# Patient Record
Sex: Male | Born: 1945 | Race: White | Hispanic: No | Marital: Married | Smoking: Never smoker
Health system: Southern US, Community
[De-identification: ages and names within clinical notes are randomized; demographics above are authoritative.]

## PROBLEM LIST (undated history)

## (undated) DIAGNOSIS — E7800 Pure hypercholesterolemia, unspecified: Secondary | ICD-10-CM

## (undated) DIAGNOSIS — I10 Essential (primary) hypertension: Secondary | ICD-10-CM

## (undated) DIAGNOSIS — I359 Nonrheumatic aortic valve disorder, unspecified: Secondary | ICD-10-CM

## (undated) DIAGNOSIS — K3189 Other diseases of stomach and duodenum: Secondary | ICD-10-CM

## (undated) DIAGNOSIS — M109 Gout, unspecified: Secondary | ICD-10-CM

## (undated) DIAGNOSIS — I2510 Atherosclerotic heart disease of native coronary artery without angina pectoris: Secondary | ICD-10-CM

## (undated) HISTORY — PX: TONSILLECTOMY: SUR1361

## (undated) HISTORY — PX: OTHER SURGICAL HISTORY: SHX169

## (undated) HISTORY — DX: Atherosclerotic heart disease of native coronary artery without angina pectoris: I25.10

## (undated) HISTORY — PX: PROSTATECTOMY: SHX69

## (undated) HISTORY — DX: Nonrheumatic aortic valve disorder, unspecified: I35.9

---

## 2015-07-03 DIAGNOSIS — I2510 Atherosclerotic heart disease of native coronary artery without angina pectoris: Secondary | ICD-10-CM

## 2015-07-03 HISTORY — DX: Atherosclerotic heart disease of native coronary artery without angina pectoris: I25.10

## 2015-07-12 ENCOUNTER — Inpatient Hospital Stay (HOSPITAL_COMMUNITY): Payer: Medicare Other

## 2015-07-12 ENCOUNTER — Ambulatory Visit
Admission: EM | Admit: 2015-07-12 | Discharge: 2015-07-12 | Disposition: A | Discharge status: Other Acute Inpatient Hospital | Payer: Medicare Other | Attending: Family Medicine | Admitting: Family Medicine

## 2015-07-12 ENCOUNTER — Emergency Department: Payer: Medicare Other

## 2015-07-12 ENCOUNTER — Encounter
Admission: EM | Disposition: A | Discharge status: Other Acute Inpatient Hospital | Payer: Self-pay | Source: Home / Self Care | Attending: Emergency Medicine

## 2015-07-12 ENCOUNTER — Encounter (HOSPITAL_COMMUNITY): Payer: Self-pay

## 2015-07-12 ENCOUNTER — Encounter: Payer: Self-pay | Admitting: Emergency Medicine

## 2015-07-12 ENCOUNTER — Emergency Department
Admission: EM | Admit: 2015-07-12 | Discharge: 2015-07-12 | Disposition: A | Discharge status: Other Acute Inpatient Hospital | Payer: Medicare Other | Attending: Emergency Medicine | Admitting: Emergency Medicine

## 2015-07-12 ENCOUNTER — Inpatient Hospital Stay (HOSPITAL_COMMUNITY)
Admission: AD | Admit: 2015-07-12 | Discharge: 2015-07-21 | DRG: 220 | Disposition: A | Discharge status: Home / Self Care | Payer: Medicare Other | Source: Other Acute Inpatient Hospital | Attending: Cardiothoracic Surgery | Admitting: Cardiothoracic Surgery

## 2015-07-12 DIAGNOSIS — I255 Ischemic cardiomyopathy: Secondary | ICD-10-CM | POA: Diagnosis present

## 2015-07-12 DIAGNOSIS — I2119 ST elevation (STEMI) myocardial infarction involving other coronary artery of inferior wall: Secondary | ICD-10-CM | POA: Diagnosis present

## 2015-07-12 DIAGNOSIS — M109 Gout, unspecified: Secondary | ICD-10-CM | POA: Diagnosis not present

## 2015-07-12 DIAGNOSIS — R0602 Shortness of breath: Secondary | ICD-10-CM | POA: Diagnosis present

## 2015-07-12 DIAGNOSIS — I2109 ST elevation (STEMI) myocardial infarction involving other coronary artery of anterior wall: Secondary | ICD-10-CM | POA: Diagnosis not present

## 2015-07-12 DIAGNOSIS — I213 ST elevation (STEMI) myocardial infarction of unspecified site: Secondary | ICD-10-CM | POA: Diagnosis not present

## 2015-07-12 DIAGNOSIS — Z8546 Personal history of malignant neoplasm of prostate: Secondary | ICD-10-CM | POA: Diagnosis not present

## 2015-07-12 DIAGNOSIS — Z8249 Family history of ischemic heart disease and other diseases of the circulatory system: Secondary | ICD-10-CM | POA: Diagnosis not present

## 2015-07-12 DIAGNOSIS — Z09 Encounter for follow-up examination after completed treatment for conditions other than malignant neoplasm: Secondary | ICD-10-CM

## 2015-07-12 DIAGNOSIS — I25110 Atherosclerotic heart disease of native coronary artery with unstable angina pectoris: Secondary | ICD-10-CM

## 2015-07-12 DIAGNOSIS — D62 Acute posthemorrhagic anemia: Secondary | ICD-10-CM | POA: Diagnosis not present

## 2015-07-12 DIAGNOSIS — Z79899 Other long term (current) drug therapy: Secondary | ICD-10-CM | POA: Insufficient documentation

## 2015-07-12 DIAGNOSIS — I359 Nonrheumatic aortic valve disorder, unspecified: Secondary | ICD-10-CM | POA: Insufficient documentation

## 2015-07-12 DIAGNOSIS — I2111 ST elevation (STEMI) myocardial infarction involving right coronary artery: Secondary | ICD-10-CM | POA: Diagnosis not present

## 2015-07-12 DIAGNOSIS — Z951 Presence of aortocoronary bypass graft: Secondary | ICD-10-CM | POA: Diagnosis not present

## 2015-07-12 DIAGNOSIS — I10 Essential (primary) hypertension: Secondary | ICD-10-CM | POA: Insufficient documentation

## 2015-07-12 DIAGNOSIS — I2510 Atherosclerotic heart disease of native coronary artery without angina pectoris: Secondary | ICD-10-CM | POA: Diagnosis present

## 2015-07-12 DIAGNOSIS — E7800 Pure hypercholesterolemia, unspecified: Secondary | ICD-10-CM | POA: Insufficient documentation

## 2015-07-12 DIAGNOSIS — I209 Angina pectoris, unspecified: Secondary | ICD-10-CM | POA: Insufficient documentation

## 2015-07-12 DIAGNOSIS — E785 Hyperlipidemia, unspecified: Secondary | ICD-10-CM | POA: Diagnosis present

## 2015-07-12 DIAGNOSIS — I480 Paroxysmal atrial fibrillation: Secondary | ICD-10-CM | POA: Diagnosis present

## 2015-07-12 DIAGNOSIS — R079 Chest pain, unspecified: Secondary | ICD-10-CM | POA: Diagnosis present

## 2015-07-12 DIAGNOSIS — E876 Hypokalemia: Secondary | ICD-10-CM | POA: Diagnosis not present

## 2015-07-12 DIAGNOSIS — I350 Nonrheumatic aortic (valve) stenosis: Secondary | ICD-10-CM | POA: Diagnosis present

## 2015-07-12 DIAGNOSIS — Z954 Presence of other heart-valve replacement: Secondary | ICD-10-CM | POA: Diagnosis not present

## 2015-07-12 DIAGNOSIS — Z01810 Encounter for preprocedural cardiovascular examination: Secondary | ICD-10-CM

## 2015-07-12 DIAGNOSIS — I219 Acute myocardial infarction, unspecified: Secondary | ICD-10-CM

## 2015-07-12 DIAGNOSIS — Z7982 Long term (current) use of aspirin: Secondary | ICD-10-CM | POA: Diagnosis not present

## 2015-07-12 DIAGNOSIS — R112 Nausea with vomiting, unspecified: Secondary | ICD-10-CM | POA: Diagnosis present

## 2015-07-12 DIAGNOSIS — Z9689 Presence of other specified functional implants: Secondary | ICD-10-CM

## 2015-07-12 DIAGNOSIS — Z9079 Acquired absence of other genital organ(s): Secondary | ICD-10-CM | POA: Diagnosis not present

## 2015-07-12 DIAGNOSIS — Z952 Presence of prosthetic heart valve: Secondary | ICD-10-CM

## 2015-07-12 DIAGNOSIS — I2101 ST elevation (STEMI) myocardial infarction involving left main coronary artery: Secondary | ICD-10-CM | POA: Diagnosis not present

## 2015-07-12 HISTORY — DX: Other diseases of stomach and duodenum: K31.89

## 2015-07-12 HISTORY — DX: Gout, unspecified: M10.9

## 2015-07-12 HISTORY — DX: Essential (primary) hypertension: I10

## 2015-07-12 HISTORY — DX: Pure hypercholesterolemia, unspecified: E78.00

## 2015-07-12 HISTORY — PX: CARDIAC CATHETERIZATION: SHX172

## 2015-07-12 LAB — CBC
HCT: 43.3 % (ref 40.0–52.0)
Hemoglobin: 14.7 g/dL (ref 13.0–18.0)
MCH: 26 pg (ref 26.0–34.0)
MCHC: 33.9 g/dL (ref 32.0–36.0)
MCV: 76.5 fL — ABNORMAL LOW (ref 80.0–100.0)
PLATELETS: 216 10*3/uL (ref 150–440)
RBC: 5.66 MIL/uL (ref 4.40–5.90)
RDW: 14.9 % — ABNORMAL HIGH (ref 11.5–14.5)
WBC: 10.3 10*3/uL (ref 3.8–10.6)

## 2015-07-12 LAB — BASIC METABOLIC PANEL
Anion gap: 12 (ref 5–15)
BUN: 23 mg/dL — ABNORMAL HIGH (ref 6–20)
CALCIUM: 9.6 mg/dL (ref 8.9–10.3)
CO2: 23 mmol/L (ref 22–32)
CREATININE: 1.54 mg/dL — AB (ref 0.61–1.24)
Chloride: 103 mmol/L (ref 101–111)
GFR, EST AFRICAN AMERICAN: 51 mL/min — AB (ref >60)
GFR, EST NON AFRICAN AMERICAN: 44 mL/min — AB (ref >60)
Glucose, Bld: 146 mg/dL — ABNORMAL HIGH (ref 65–99)
Potassium: 3.2 mmol/L — ABNORMAL LOW (ref 3.5–5.1)
SODIUM: 138 mmol/L (ref 135–145)

## 2015-07-12 LAB — TROPONIN I
TROPONIN I: 0.91 ng/mL — AB (ref <0.031)
Troponin I: 21.94 ng/mL (ref <0.031)

## 2015-07-12 LAB — HEPARIN LEVEL (UNFRACTIONATED): HEPARIN UNFRACTIONATED: 0.16 [IU]/mL — AB (ref 0.30–0.70)

## 2015-07-12 LAB — SURGICAL PCR SCREEN
MRSA, PCR: NEGATIVE
STAPHYLOCOCCUS AUREUS: NEGATIVE

## 2015-07-12 SURGERY — RIGHT/LEFT HEART CATH AND CORONARY ANGIOGRAPHY
Anesthesia: Moderate Sedation

## 2015-07-12 MED ORDER — ASPIRIN EC 81 MG PO TBEC
81.0000 mg | DELAYED_RELEASE_TABLET | Freq: Every day | ORAL | Status: DC
  Administered 2015-07-13 – 2015-07-14 (×2): 81 mg via ORAL
  Filled 2015-07-12 (×2): qty 1

## 2015-07-12 MED ORDER — PERFLUTREN LIPID MICROSPHERE
1.0000 mL | INTRAVENOUS | Status: AC | PRN
  Administered 2015-07-12: 3 mL via INTRAVENOUS
  Filled 2015-07-12: qty 10

## 2015-07-12 MED ORDER — ASPIRIN 81 MG PO CHEW: 81.0000 mg | CHEWABLE_TABLET | Freq: Every day | ORAL | Status: DC

## 2015-07-12 MED ORDER — SUCRALFATE 1 G PO TABS
1.0000 g | ORAL_TABLET | Freq: Every day | ORAL | Status: DC
  Administered 2015-07-13 – 2015-07-14 (×2): 1 g via ORAL
  Filled 2015-07-12 (×2): qty 1

## 2015-07-12 MED ORDER — HEPARIN (PORCINE) IN NACL 2-0.9 UNIT/ML-% IJ SOLN
INTRAMUSCULAR | Status: AC
Start: 2015-07-12 — End: 2015-07-12
  Filled 2015-07-12: qty 1000

## 2015-07-12 MED ORDER — IOHEXOL 300 MG/ML  SOLN
INTRAMUSCULAR | Status: DC | PRN
  Administered 2015-07-12: 225 mL via INTRA_ARTERIAL

## 2015-07-12 MED ORDER — NITROGLYCERIN 0.4 MG SL SUBL: 0.4000 mg | SUBLINGUAL_TABLET | SUBLINGUAL | Status: DC | PRN

## 2015-07-12 MED ORDER — ATORVASTATIN CALCIUM 40 MG PO TABS
40.0000 mg | ORAL_TABLET | Freq: Every day | ORAL | Status: DC
  Administered 2015-07-13 – 2015-07-20 (×6): 40 mg via ORAL
  Filled 2015-07-12 (×8): qty 1

## 2015-07-12 MED ORDER — HEPARIN SODIUM (PORCINE) 1000 UNIT/ML IJ SOLN
INTRAMUSCULAR | Status: AC
  Filled 2015-07-12: qty 1

## 2015-07-12 MED ORDER — MIDAZOLAM HCL 2 MG/2ML IJ SOLN
INTRAMUSCULAR | Status: DC | PRN
  Administered 2015-07-12 (×2): 1 mg via INTRAVENOUS

## 2015-07-12 MED ORDER — HEPARIN (PORCINE) IN NACL 100-0.45 UNIT/ML-% IJ SOLN
INTRAMUSCULAR | Status: DC | PRN
  Administered 2015-07-12: 1000 [IU]/h via INTRAVENOUS

## 2015-07-12 MED ORDER — HEPARIN (PORCINE) IN NACL 100-0.45 UNIT/ML-% IJ SOLN
INTRAMUSCULAR | Status: AC
  Filled 2015-07-12: qty 250

## 2015-07-12 MED ORDER — HEPARIN SODIUM (PORCINE) 1000 UNIT/ML IJ SOLN
INTRAMUSCULAR | Status: DC | PRN
  Administered 2015-07-12: 6000 [IU] via INTRAVENOUS

## 2015-07-12 MED ORDER — VERAPAMIL HCL 2.5 MG/ML IV SOLN
INTRAVENOUS | Status: DC | PRN
  Administered 2015-07-12: 2.5 mg via INTRA_ARTERIAL

## 2015-07-12 MED ORDER — HEPARIN (PORCINE) IN NACL 100-0.45 UNIT/ML-% IJ SOLN: 12.0000 [IU]/kg/h | Freq: Once | INTRAMUSCULAR | Status: DC

## 2015-07-12 MED ORDER — ASPIRIN 81 MG PO CHEW
324.0000 mg | CHEWABLE_TABLET | Freq: Once | ORAL | Status: AC
  Administered 2015-07-12: 324 mg via ORAL

## 2015-07-12 MED ORDER — FENTANYL CITRATE (PF) 100 MCG/2ML IJ SOLN
INTRAMUSCULAR | Status: AC
  Filled 2015-07-12: qty 2

## 2015-07-12 MED ORDER — MIDAZOLAM HCL 2 MG/2ML IJ SOLN
INTRAMUSCULAR | Status: AC
  Filled 2015-07-12: qty 2

## 2015-07-12 MED ORDER — ACETAMINOPHEN 325 MG PO TABS: 650.0000 mg | ORAL_TABLET | ORAL | Status: DC | PRN

## 2015-07-12 MED ORDER — PERFLUTREN LIPID MICROSPHERE
INTRAVENOUS | Status: AC
  Administered 2015-07-12: 3 mL via INTRAVENOUS
  Filled 2015-07-12: qty 10

## 2015-07-12 MED ORDER — HEPARIN (PORCINE) IN NACL 100-0.45 UNIT/ML-% IJ SOLN
1500.0000 [IU]/h | INTRAMUSCULAR | Status: DC
  Administered 2015-07-12: 1000 [IU]/h via INTRAVENOUS
  Administered 2015-07-13: 1300 [IU]/h via INTRAVENOUS
  Administered 2015-07-14: 1500 [IU]/h via INTRAVENOUS
  Filled 2015-07-12 (×3): qty 250

## 2015-07-12 MED ORDER — VERAPAMIL HCL 2.5 MG/ML IV SOLN
INTRAVENOUS | Status: AC
Start: 2015-07-12 — End: ?
  Filled 2015-07-12: qty 2

## 2015-07-12 MED ORDER — FENTANYL CITRATE (PF) 100 MCG/2ML IJ SOLN
INTRAMUSCULAR | Status: DC | PRN
  Administered 2015-07-12 (×2): 25 ug via INTRAVENOUS

## 2015-07-12 MED ORDER — NITROGLYCERIN 5 MG/ML IV SOLN
INTRAVENOUS | Status: AC
  Filled 2015-07-12: qty 10

## 2015-07-12 MED ORDER — ONDANSETRON HCL 4 MG/2ML IJ SOLN: 4.0000 mg | Freq: Four times a day (QID) | INTRAMUSCULAR | Status: DC | PRN

## 2015-07-12 MED ORDER — HEPARIN SODIUM (PORCINE) 5000 UNIT/ML IJ SOLN
4000.0000 [IU] | Freq: Once | INTRAMUSCULAR | Status: AC
  Administered 2015-07-12: 4000 [IU] via INTRAVENOUS

## 2015-07-12 SURGICAL SUPPLY — 22 items
CATH 5F 110X4 TIG (CATHETERS) ×3 IMPLANT
CATH IAB 7FR 40ML (CATHETERS) ×3 IMPLANT
CATH INFINITI 5FR ANG PIGTAIL (CATHETERS) ×3 IMPLANT
CATH INFINITI 5FR JL4 (CATHETERS) ×3 IMPLANT
CATH INFINITI 5FR JL5 (CATHETERS) ×3 IMPLANT
CATH INFINITI JR4 5F (CATHETERS) IMPLANT
CATH SWANZ 7F THERMO (CATHETERS) ×3 IMPLANT
CATH VISTA GUIDE 6FR AR1 (CATHETERS) ×3 IMPLANT
CATH VISTA GUIDE 6FR JR4 (CATHETERS) ×3 IMPLANT
DEVICE INFLAT 30 PLUS (MISCELLANEOUS) ×3 IMPLANT
DEVICE RAD TR BAND REGULAR (VASCULAR PRODUCTS) ×3 IMPLANT
DEVICE SECURE STATLOCK IABP (MISCELLANEOUS) ×6 IMPLANT
GLIDESHEATH SLEND SS 6F .021 (SHEATH) ×3 IMPLANT
KIT MANI 3VAL PERCEP (MISCELLANEOUS) ×3 IMPLANT
KIT TRANSPAC II SGL 4260605 (MISCELLANEOUS) ×6 IMPLANT
NEEDLE PERC 18GX7CM (NEEDLE) ×3 IMPLANT
PACK CARDIAC CATH (CUSTOM PROCEDURE TRAY) ×3 IMPLANT
SHEATH AVANTI 5FR X 11CM (SHEATH) ×3 IMPLANT
SHEATH PINNACLE 7F 10CM (SHEATH) ×3 IMPLANT
SUT SILK 0 FSL (SUTURE) ×3 IMPLANT
WIRE RUNTHROUGH .014X180CM (WIRE) ×3 IMPLANT
WIRE SAFE-T 1.5MM-J .035X260CM (WIRE) ×3 IMPLANT

## 2015-07-12 NOTE — Progress Notes (Signed)
ANTICOAGULATION CONSULT NOTE - Follow Up Consult  Pharmacy Consult for heparin Indication: CAD awaiting CABG+/-AVR  Labs:  Recent Labs  07/12/15 1334 07/12/15 1955 07/12/15 2213  HGB 14.7  --   --   HCT 43.3  --   --   PLT 216  --   --   HEPARINUNFRC  --   --  0.16*  CREATININE 1.54*  --   --   TROPONINI 0.91* 21.94*  --     Assessment: 70yo male subtherapeutic on heparin with initial dosing for IABP while awaiting OHS.  Goal of Therapy:  Heparin level 0.2-0.5 units/ml   Plan:  Will increase heparin gtt by 1 unit/kg/hr to 1100 units/hr and check level in 8hr.  Vernard Gambles, PharmD, BCPS  07/12/2015,11:40 PM

## 2015-07-12 NOTE — Consult Note (Signed)
[address]       [place] [address]             [phone]        [facility] Medical Record #[id number] Date of Birth: [birth year]  Referring:  Dr Kirke Corin Primary Care: Asencion Partridge, MD  Chief Complaint:   Nausea and vomiting with SOB  History of Present Illness:     Patient is  70 year old male who presented to [facility] urgent care with SOB nausea and vomiting  which started around 11:00 in the morning. He has having chest pressure and indigestion with nausea for past month especially when he went down stares and worked in [employer]. Today he was in grocery store getting supplies for horse show tomorrow and became very nauseated, went out of store and was going to call ems but saw urgent care so went there   It was described as tightness and heartburn. It was associated with nausea but no vomiting. The patient reports having similar symptoms in the last few months which he has attributed to his GI system. Upon arrival to urgent care, an EKG was performed which showed sinus tachycardia with inferior ST elevation and thus he was transferred to [facility] emergency room. A code STEMI was activated. Now at 7:00pm patient arrives at [facility] CC[facility] , says he feels good with no chest pain, echocardiogram is pending  No previous cardiac history but reports being told about a heart murmur due to rheumatic heart disease. He has chronic medical conditions that include hypertension, hyperlipidemia, obesity and gout.  Current Activity/ Functional Status: Patient is independent with mobility/ambulation, transfers, ADL's, IADL's.   Zubrod Score: At the time of surgery this patient's most appropriate activity status/level should be described as: [x]     0    Normal activity, no symptoms []     1    Restricted in physical strenuous activity but ambulatory, able to do out light work []     2    Ambulatory and capable of self care, unable to do work activities, up and about                  more than 50%  Of the time                            []     3    Only limited self care, in bed greater than 50% of waking hours []     4    Completely disabled, no self care, confined to bed or chair []     5    Moribund  Past Medical History  Diagnosis Date  . Gastric acidity   . Hypertension   . Hypercholesteremia   . Gout     Past Surgical History  Procedure Laterality Date  . Tonsillectomy    . Right rtc repair    . Prostatectomy      History  Smoking status  . Never Smoker   Smokeless tobacco  . Not on file    History  Alcohol Use  . 0.6 oz/week  . 1 Glasses of wine per week    Comment: Occ.    Social History   Social History  . Marital Status: Married    Spouse Name: N/A  . Number of Children: N/A  . Years of Education: N/A   Occupational History  . Not on file.   Social  History Main Topics  . Smoking status: Never Smoker   . Smokeless tobacco: Not on file  . Alcohol Use: 0.6 oz/week    1 Glasses of wine per week     Comment: Occ.  . Drug Use: Not on file  . Sexual Activity: Not on file   Other Topics Concern  . Not on file   Social History Narrative    No Known Allergies  Current Facility-Administered Medications  Medication Dose Route Frequency Provider Last Rate Last Dose  . acetaminophen (TYLENOL) tablet 650 mg  650 mg Oral Q4H PRN Ellsworth Lennox, PA      . aspirin chewable tablet 81 mg  81 mg Oral Daily [place], [place]      . atorvastatin (LIPITOR) tablet 40 mg  40 mg Oral QHS [place], [place]      . nitroGLYCERIN (NITROSTAT) SL tablet 0.4 mg  0.4 mg Sublingual Q5 Min x 3 PRN Ellsworth Lennox, [place]      . ondansetron [facility]) injection 4 mg  4 mg Intravenous Q6H PRN Ellsworth Lennox, [place]      . perflutren lipid microspheres (DEFINITY) IV suspension  1-10 mL Intravenous PRN Ellsworth Lennox, PA   3 mL at 07/12/15 1957  . sucralfate (CARAFATE) tablet 1 g  1 g Oral Q6H PRN Ellsworth Lennox, [place]         Prescriptions prior to admission  Medication Sig Dispense Refill Last Dose  . aspirin 81 MG chewable tablet Chew 81 mg by mouth daily.   unknown at unknown   . atorvastatin (LIPITOR) 40 MG tablet Take 40 mg by mouth at bedtime.   unknown at unknown  . colchicine 0.6 MG tablet Take 0.6 mg by mouth daily.   unknown at unknown  . diltiazem (DILACOR XR) 240 MG 24 hr capsule Take 240 mg by mouth daily.   unknown at unknown  . hydrochlorothiazide (HYDRODIURIL) 25 MG tablet Take 25 mg by mouth daily.   unknown at unknown  . losartan (COZAAR) 50 MG tablet Take 100 mg by mouth daily.   unknown at unknown  . sucralfate (CARAFATE) 1 g tablet Take 1 g by mouth every 6 (six) hours as needed (for indigestion/heartburn).    unknown at unknown    No family history on file.   Review of Systems:      Cardiac Review of Systems: Y or N  Chest Pain Cove.Etienne    ]  Resting SOB Milo.Brash   ] Exertional SOB  Cove.Etienne  ]  Pollyann Kennedy Milo.Brash  ]   Pedal Edema [ n  ]    Palpitations Milo.Brash  ] Syncope  Milo.Brash  ]   Presyncope Milo.Brash   ]  General Review of Systems: [Y] = yes [  ]=no Constitional: recent weight change [  ]; anorexia [  ]; fatigue Cove.Etienne  ]; nausea [  y]; night sweats [  ]; fever [n  ]; or chills [n  ]                                                               Dental: poor dentition[  ]; Last Dentist visit:   Eye : blurred vision [  ]; diplopia [   ]; vision changes [  ];  Amaurosis fugax[  ]; Resp: cough [  ];  wheezing[  ];  hemoptysis[  ]; shortness of breath[  ]; paroxysmal nocturnal dyspnea[  ]; dyspnea on exertion[  ]; or orthopnea[  ];  GI:  gallstones[  ], vomiting[  ];  dysphagia[  ]; melena[  ];  hematochezia [  ]; heartburn[  ];   Hx of  Colonoscopy[  ]; GU: kidney stones [  ]; hematuria[  ];   dysuria [  ];  nocturia[  ];  history of     obstruction [  ]; urinary frequency [ y  ]             Skin: rash, swelling[  ];, hair loss[  ];  peripheral edema[  ];  or itching[  ]; Musculosketetal: myalgias[  ];  joint swelling[  ];   joint erythema[  ];  joint pain[  ];  back pain[  ];  Heme/Lymph: bruising[  ];  bleeding[  ];  anemia[  ];  Neuro: TIA[n  ];  headachesn[  ];  stroke[n  ];  vertigo[  ];  seizures[  ];   paresthesias[  ];  difficulty walking[n  ];  Psych:depression[  ]; anxiety[  ];  Endocrine: diabetes[  ];  thyroid dysfunction[  ];  Immunizations: Flu [  ]; Pneumococcal[  ];  Other:  Physical Exam: Ht 5\' 11"  (1.803 m)  Wt 252 lb 3.3 oz (114.4 kg)  BMI 35.19 kg/m2   General appearance: alert, cooperative and no distress Head: Normocephalic, without obvious abnormality, atraumatic Neck: no adenopathy, no carotid bruit, no JVD, supple, symmetrical, trachea midline and thyroid not enlarged, symmetric, no tenderness/mass/nodules Lymph nodes: Cervical, supraclavicular, and axillary nodes normal. Resp: diminished breath sounds bibasilar Back: negative, symmetric, no curvature. ROM normal. No CVA tenderness. Cardio: systolic murmur: holosystolic 3/6, crescendo at 2nd left intercostal space GI: soft, non-tender; bowel sounds normal; no masses,  no organomegaly Extremities: venous stasis dermatitis noted Neurologic: Grossly normal IAB in place right radial band in place   Diagnostic Studies & Laboratory data:    CATH:  LM lesion, 70% stenosed.  Ost LAD to Prox LAD lesion, 80% stenosed.  Mid LAD lesion, 90% stenosed.  Prox Cx lesion, 90% stenosed.  Ost RCA to Prox RCA lesion, 95% stenosed.  Mid RCA lesion, 100% stenosed.  1st Mrg lesion, 99% stenosed.  1. Severe three-vessel and left main coronary artery disease. The coronary arteries are overall heavily calcified. The culprit as an occluded mid right coronary artery which is heavily calcified with left-to-right collaterals. The LAD also wraps around the apex. The vessel is not optimal for PCI.  2. Severe aortic stenosis with a peak to peak gradient of 30 mmHg and calculated valve area of 0.8 cm  3. Severely elevated left ventricular  end-diastolic pressure. Left ventricular angiography was not performed as the patient developed left bundle branch block with left ventricular engagement and LVEDP was severely elevated.  4. Right heart catheterization showed only mild pulmonary hypertension with a pulmonary pressure of 36/22. Wedge pressure was 24 mmHg and cardiac output was 4.4 with a cardiac index of 1.93.  5. Successful intra-aortic balloon pump placement.  Recommendations: Continue anticoagulation with heparin. Continue intra-aortic balloon pump support. Obtain a stat echocardiogram upon arrival to [facility]. Consult her to thoracic surgery for urgent CABG/aortic valve replacement.      Recent Radiology Findings:   No results found.   I have independently reviewed the above radiologic studies.  Recent Lab Findings:  Lab Results  Component Value Date   WBC 10.3 07/12/2015   HGB 14.7 07/12/2015   HCT 43.3 07/12/2015   PLT 216 07/12/2015   GLUCOSE 146* 07/12/2015   NA 138 07/12/2015   K 3.2* 07/12/2015   CL 103 07/12/2015   CREATININE 1.54* 07/12/2015   BUN 23* 07/12/2015   CO2 23 07/12/2015   Lab Results  Component Value Date   TROPONINI 0.91* 07/12/2015   Chronic Kidney Disease   Stage I     GFR >90  Stage II    GFR 60-89  Stage IIIA GFR 45-59  Stage IIIB GFR 30-44  Stage IV   GFR 15-29  Stage V    GFR  <15  Lab Results  Component Value Date   CREATININE 1.54* 07/12/2015   Estimated Creatinine Clearance: 56.6 mL/min (by C-G formula based on Cr of 1.54).  Assessment / Plan:   Inferior mi acute with probable AS, but not full evaluated, echo pending  Stage III3a CKD   History of Prostate cancer s/p prostatomy   Patient needs complete evaluation before considering CABG urgently, echo to be done Currently pain free and feels good, will not proceed with "emergency CABG AVR"   Discussed with patient ultimate need for cabg and possible avr after echo to evaluate Aortic and mitral valeves and lv  function   I  spent 40 minutes counseling the patient face to face and 50% or more the  time was spent in counseling and coordination of care. The total time spent in the appointment was 60 minutes.    Delight Ovens MD      [address] [place] [place] [address] Office [phone]   Beeper [phone]  07/12/2015 8:10 PM

## 2015-07-12 NOTE — ED Notes (Signed)
Report to hal in cath lab.

## 2015-07-12 NOTE — ED Provider Notes (Signed)
[facility] Emergency Department Provider Note     Time seen: ----------------------------------------- 1:33 PM on 07/12/2015 -----------------------------------------    I have reviewed the triage vital signs and the nursing notes.   HISTORY  Chief Complaint Chest Pain    HPI Wesley Martinez is a 70 y.o. male who presents to the ER from urgent care with shortness of breath and chest pain. Patient received aspirin and 2 nitroglycerin tablets at the urgent care. Patient states initially his symptoms were chest pressure and shortness of breath that started about 11 AM. Patient also had some sweating and vomiting during that period time. He states he's had several episodes of this in the past that were resolved with belching. He has never had a heart catheter or stress test. Pain is 2 out of 10 currently.   Past Medical History  Diagnosis Date  . Gastric acidity   . Hypertension     There are no active problems to display for this patient.   Past Surgical History  Procedure Laterality Date  . Tonsillectomy    . Right rtc repair    . Prostatectomy      Allergies Review of patient's allergies indicates no known allergies.  Social History Social History  Substance Use Topics  . Smoking status: Never Smoker   . Smokeless tobacco: None  . Alcohol Use: 0.6 oz/week    1 Glasses of wine per week     Comment: Occ.    Review of Systems Constitutional: Negative for fever. Eyes: Negative for visual changes. ENT: Negative for sore throat. Cardiovascular: Positive for chest pain Respiratory: Positive for shortness of breath Gastrointestinal: Negative for abdominal pain, vomiting and diarrhea. Genitourinary: Negative for dysuria. Musculoskeletal: Negative for back pain. Skin: Negative for rash. Neurological: Negative for headaches, focal weakness or numbness.  10-point ROS otherwise  negative.  ____________________________________________   PHYSICAL EXAM:  VITAL SIGNS: ED Triage Vitals  Enc Vitals Group     BP --      Pulse --      Resp --      Temp --      Temp src --      SpO2 --      Weight --      Height --      Head Cir --      Peak Flow --      Pain Score 07/12/15 1332 3     Pain Loc --      Pain Edu? --      Excl. in GC? --     Constitutional: Alert and oriented. Anxious but in no acute distress Eyes: Conjunctivae are normal. PERRL. Normal extraocular movements. ENT   Head: Normocephalic and atraumatic.   Nose: No congestion/rhinnorhea.   Mouth/Throat: Mucous membranes are moist.   Neck: No stridor. Cardiovascular: Normal rate, regular rhythm. Normal and symmetric distal pulses are present in all extremities. No murmurs, rubs, or gallops. Respiratory: Normal respiratory effort without tachypnea nor retractions. Breath sounds are clear and equal bilaterally. No wheezes/rales/rhonchi. Gastrointestinal: Soft and nontender. No distention. No abdominal bruits.  Musculoskeletal: Nontender with normal range of motion in all extremities. No joint effusions.  No lower extremity tenderness nor edema. Neurologic:  Normal speech and language. No gross focal neurologic deficits are appreciated. Speech is normal. No gait instability. Skin:  Skin is warm, dry and intact. No rash noted. Psychiatric: Mood and affect are normal. Speech and behavior are normal. Patient exhibits appropriate insight and judgment. ____________________________________________  EKG: Interpreted by me. Sinus tachycardia with a rate of 105 bpm, normal PR interval, normal QRS, normal QT interval. Inferior ST elevation is noted. Possible anterior infarct as well. EKG meets criteria for ST elevation MI  ____________________________________________  ED COURSE:  Pertinent labs & imaging results that were available during my care of the patient were reviewed by me and  considered in my medical decision making (see chart for details). Patient's chest pain is down to 2 out of 10. Currently he is in no acute distress. We will consult cardiology and activate the heart catheterization team. ____________________________________________    LABS (pertinent positives/negatives)  Labs Reviewed  BASIC METABOLIC PANEL  CBC  TROPONIN I    RADIOLOGY  Chest x-ray Is unremarkable  ____________________________________________  FINAL ASSESSMENT AND PLAN  Chest pain, ST elevation MI  Plan: Patient with labs and imaging as dictated above. Patient with evidence of a ST elevation MI. Cardiology will be taken to the Cath Lab for further evaluation. He'll be started on heparin prior to catheterization   Emily Filbert, MD   Emily Filbert, MD 07/12/15 [phone]

## 2015-07-12 NOTE — ED Notes (Signed)
NTG SL #2 at 1300, BP 144/108

## 2015-07-12 NOTE — Progress Notes (Signed)
Pre-op Cardiac Surgery PATIENT WAS ON A BALLOON PUMP DURING THIS EXAM  Carotid Findings:  No obvious evidence of hemodynamically significant internal carotid artery stenosis bilaterally. The right vertebral artery is patent with antegrade flow. Unable to visualize the left vertebral arteries.  Upper Extremity Right Left  Brachial Pressures Patent 148-Patent  Radial Waveforms patent patent  Ulnar Waveforms patent patent  Palmar Arch (Allen's Test) Unable to adequately evaluate due to balloon pump Unable to adequately evaluate due to balloon pump     Lower  Extremity Right Left  Dorsalis Pedis Biphasic Triphasic  Anterior Tibial    Posterior Tibial Triphasic Triphasic  Ankle/Brachial Indices      Findings:   All evaluated arteries are patent with flow within normal limits.  07/12/2015 8:55 PM Gertie Fey, RVT, RDCS, RDMS

## 2015-07-12 NOTE — Discharge Instructions (Signed)
Aspirin and Your Heart  Aspirin is a medicine that affects the way blood clots. Aspirin can be used to help reduce the risk of blood clots, heart attacks, and other heart-related problems.  SHOULD I TAKE ASPIRIN? Your health care provider will help you determine whether it is safe and beneficial for you to take aspirin daily. Taking aspirin daily may be beneficial if you:  Have had a heart attack or chest pain.  Have undergone open heart surgery such as coronary artery bypass surgery (CABG).  Have had coronary angioplasty.  Have experienced a stroke or transient ischemic attack (TIA).  Have peripheral vascular disease (PVD).  Have chronic heart rhythm problems such as atrial fibrillation. ARE THERE ANY RISKS OF TAKING ASPIRIN DAILY? Daily use of aspirin can increase your risk of side effects. Some of these include:  Bleeding. Bleeding problems can be minor or serious. An example of a minor problem is a cut that does not stop bleeding. An example of a more serious problem is stomach bleeding or bleeding into the brain. Your risk of bleeding is increased if you are also taking non-steroidal anti-inflammatory medicine (NSAIDs).  Increased bruising.  Upset stomach.  An allergic reaction. People who have nasal polyps have an increased risk of developing an aspirin allergy. WHAT ARE SOME GUIDELINES I SHOULD FOLLOW WHEN TAKING ASPIRIN?   Take aspirin only as directed by your health care provider. Make sure you understand how much you should take and what form you should take. The two forms of aspirin are:  Non-enteric-coated. This type of aspirin does not have a coating and is absorbed quickly. Non-enteric-coated aspirin is usually recommended for people with chest pain. This type of aspirin also comes in a chewable form.  Enteric-coated. This type of aspirin has a special coating that releases the medicine very slowly. Enteric-coated aspirin causes less stomach upset than non-enteric-coated  aspirin. This type of aspirin should not be chewed or crushed.  Drink alcohol in moderation. Drinking alcohol increases your risk of bleeding. WHEN SHOULD I SEEK MEDICAL CARE?   You have unusual bleeding or bruising.  You have stomach pain.  You have an allergic reaction. Symptoms of an allergic reaction include:  Hives.  Itchy skin.  Swelling of the lips, tongue, or face.  You have ringing in your ears. WHEN SHOULD I SEEK IMMEDIATE MEDICAL CARE?   Your bowel movements are bloody, dark red, or black in color.  You vomit or cough up blood.  You have blood in your urine.  You cough, wheeze, or feel short of breath. If you have any of the following symptoms, this is an emergency. Do not wait to see if the pain will go away. Get medical help at once. Call your local emergency services (911 in the [place] Do not drive yourself to the hospital.  You have severe chest pain, especially if the pain is crushing or pressure-like and spreads to the arms, back, neck, or jaw.  You have stroke-like symptoms, such as:   Loss of vision.   Difficulty talking.   Numbness or weakness on one side of your body.   Numbness or weakness in your arm or leg.   Not thinking clearly or feeling confused.    This information is not intended to replace advice given to you by your health care provider. Make sure you discuss any questions you have with your health care provider.   Document Released: 04/02/2008 Document Revised: 05/11/2014 Document Reviewed: 07/26/2013 Elsevier Interactive Patient Education 2016 Elsevier  Inc.  Acute Coronary Syndrome Acute coronary syndrome (ACS) is a serious problem in which there is suddenly not enough blood and oxygen supplied to the heart. ACS may mean that one or more of the blood vessels in your heart (coronary arteries) may be blocked. ACS can result in chest pain or a heart attack (myocardial infarction or MI). CAUSES This condition is caused by  atherosclerosis, which is the buildup of fat and cholesterol (plaque) on the inside of the arteries. Over time, the plaque may narrow or block the artery, and this will lessen blood flow to the heart. Plaque can also become weak and break off within a coronary artery to form a clot and cause a sudden blockage. RISK FACTORS The risks factors of this condition include:  High cholesterol levels.  High blood pressure (hypertension).  Smoking.  Diabetes.  Age.  Family history of chest pain, heart disease, or stroke.  Lack of exercise. SYMPTOMS The most common signs of this condition include:  Chest pain, which can be:  A crushing or squeezing in the chest.  A tightness, pressure, fullness, or heaviness in the chest.  Present for more than a few minutes, or it can stop and recur.  Pain in the arms, neck, jaw, or back.  Unexplained heartburn or indigestion.  Shortness of breath.  Nausea.  Sudden cold sweats.  Feeling light-headed or dizzy. Sometimes, this condition has no symptoms. DIAGNOSIS ACS may be diagnosed through the following tests:  Electrocardiogram (ECG).  Blood tests.  Coronary angiogram. This is a procedure to look at the coronary arteries to see if there is any blockage. TREATMENT Treatment for ACS may include:  Healthy behavioral changes to reduce or control risk factors.  Medicine.  Coronary stenting.A stent helps to keep an artery open.  Coronary angioplasty. This procedure widens a narrowed or blocked artery.  Coronary artery bypass surgery. This will allow your blood to pass the blockage (bypass) to reach your heart. HOME CARE INSTRUCTIONS Eating and Drinking  Follow a heart-healthy diet. A dietitian can you help to educate you about healthy food options and changes.  Use healthy cooking methods such as roasting, grilling, broiling, baking, poaching, steaming, or stir-frying. Talk to a dietitian to learn more about healthy cooking  methods. Medicines  Take medicines only as directed by your health care provider.  Do not take the following medicines unless your health care provider approves:  Nonsteroidal anti-inflammatory drugs (NSAIDs), such as ibuprofen, naproxen, or celecoxib.  Vitamin supplements that contain vitamin A, vitamin E, or both.  Hormone replacement therapy that contains estrogen with or without progestin.  Stop illegal drug use. Activities  Follow an exercise program that is approved by your health care provider.  Plan rest periods when you are fatigued. Lifestyle  Do not use any tobacco products, including cigarettes, chewing tobacco, or electronic cigarettes. If you need help quitting, ask your health care provider.  If you drink alcohol, and your health care provider approves, limit your alcohol intake to no more than 1 drink per day. One drink equals 12 ounces of beer, 5 ounces of wine, or 1 ounces of hard liquor.  Learn to manage stress.  Maintain a healthy weight. Lose weight as approved by your health care provider. General Instructions  Manage other health conditions, such as hypertension and diabetes, as directed by your health care provider.  Keep all follow-up visits as directed by your health care provider. This is important.  Your health care provider may ask you to  to monitor your blood pressure. A blood pressure reading consists of a higher number over a lower number, such as 110 over 72, written as 110/72. Ideally, your blood pressure should be: °¨ Below 140/90 if you have no other medical conditions. °¨ Below 130/80 if you have diabetes or kidney disease. °SEEK IMMEDIATE MEDICAL CARE IF: °· You have pain in your chest, neck, arm, jaw, stomach, or back that lasts more than a few minutes, is recurring, or is not relieved by taking medicine under your tongue (sublingual nitroglycerin). °· You have profuse sweating without cause. °· You have unexplained: °¨ Heartburn or  indigestion. °¨ Shortness of breath or difficulty breathing. °¨ Nausea or vomiting. °¨ Fatigue. °¨ Feelings of nervousness or anxiety. °¨ Weakness. °¨ Diarrhea. °· You have sudden light-headedness or dizziness. °· You faint. °These symptoms may represent a serious problem that is an emergency. Do not wait to see if the symptoms will go away. Get medical help right away. Call your local emergency services (911 in the [place] Do not drive yourself to the clinic or hospital. °  °This information is not intended to replace advice given to you by your health care provider. Make sure you discuss any questions you have with your health care provider. °  °Document Released: 04/20/2005 Document Revised: 05/11/2014 Document Reviewed: 08/22/2013 °Elsevier Interactive Patient Education ©2016 Elsevier Inc. ° °

## 2015-07-12 NOTE — Progress Notes (Signed)
CRITICAL VALUE ALERT  Critical value received:  Troponin  Date of notification:  07/12/15  Time of notification:  1955  Critical value read back:Yes.    Nurse who received alert:  Alycia Rossetti  MD notified (1st page):  Dr. Marilu Favre  Time of first page: 2000  Responding MD:  Dr. Marilu Favre  Time MD responded:  Expected value

## 2015-07-12 NOTE — Progress Notes (Signed)
ANTICOAGULATION CONSULT NOTE - Initial Consult  Pharmacy Consult for Heparin Indication: chest pain/ACS  No Known Allergies  Patient Measurements: Height: 5\' 11"  (180.3 cm) Weight: 252 lb 3.3 oz (114.4 kg) IBW/kg (Calculated) : 75.3 Heparin Dosing Weight: 100 kg  Vital Signs: Temp: 97.6 F (36.4 C) (03/10 1335) Temp Source: Oral (03/10 1335) BP: 162/98 mmHg (03/10 1335) Pulse Rate: 108 (03/10 1335)  Labs:  Recent Labs  07/12/15 1334  HGB 14.7  HCT 43.3  PLT 216  CREATININE 1.54*  TROPONINI 0.91*    Estimated Creatinine Clearance: 56.6 mL/min (by C-G formula based on Cr of 1.54).   Medical History: Past Medical History  Diagnosis Date  . Gastric acidity   . Hypertension   . Hypercholesteremia   . Gout    Assessment: 70 year old male admitted with code STEMI and s/p emergent cardiac cath found to have 3 vessel and left main CAD with subsequent intra-aortic balloon pump placement and plan for CABG with aortic valve replacement likely Monday to continue IV heparin.  Patient was initiated on 1000 units/hr heparin drip initiated per procedure log at 1540 PM. CBC was within normal limits prior to procedure. Discussed therapy with patient who voiced understanding.    Goal of Therapy:  Heparin level 0.2 to 0.5 units/ml while IABP in place Monitor platelets by anticoagulation protocol: Yes   Plan:  Continue Heparin drip at 1000 units/hr. Heparin level in 6 hours - at 2200PM.   Daily heparin level and CBC while on therapy.  Monitor for signs and symptoms of bleeding.   Link Snuffer, PharmD, BCPS Clinical Pharmacist [phone]  07/12/2015,7:46 PM

## 2015-07-12 NOTE — ED Notes (Addendum)
Patient states that he has chest pressure and shortness of pressure which started 1 hour ago.  He states that he has a history of these symptoms and states that they are from gastric issues that he has.  Pain is 3-4 currently.  He also comes in today breathing rather quickly which he says is only because he is nervous.

## 2015-07-12 NOTE — Progress Notes (Signed)
  Echocardiogram 2D Echocardiogram with Definity has been performed.  Nolon Rod 07/12/2015, 8:14 PM

## 2015-07-12 NOTE — H&P (Signed)
History and Physical  Patient ID: Wesley Martinez MRN: [id number] DOB/AGE: [birth year] 70 y.o. Admit date: 07/12/2015  Primary Care Physician: Renford Dills, MD at [facility] Primary Cardiologist : New (Dr. Kirke Corin)  HPI:  This is a 70 year old male who presented to [facility] urgent care with chest pain which started around 11:00 in the morning. He has no previous cardiac history but reports being told about a heart murmur due to rheumatic heart disease. He has chronic medical conditions that include hypertension, hyperlipidemia, obesity and gout. Chest pain started at 11:00 this morning and was left sided with no radiation. It was described as tightness and heartburn. It was associated with nausea but no vomiting. The patient reports having similar symptoms in the last few months which he has attributed to his GI system. Thus, he did not seek medical attention earlier. Upon arrival to urgent care, an EKG was performed which showed sinus tachycardia with inferior ST elevation and thus he was transferred to [facility] emergency room. A code STEMI was activated. At the time of my evaluation, he was still having 5 out of 10 chest pain.  A 10 point review of system was performed. It is negative other than that mentioned in the history of present illness.   Past Medical History  Diagnosis Date  . Gastric acidity   . Hypertension   . Hypercholesteremia   . Gout     History reviewed.  Family history: Remarkable for coronary artery disease and hypertension.  Social History   Social History  . Marital Status: Married    Spouse Name: N/A  . Number of Children: N/A  . Years of Education: N/A   Occupational History  . Not on file.   Social History Main Topics  . Smoking status: Never Smoker   . Smokeless tobacco: Not on file  . Alcohol Use: 0.6 oz/week    1 Glasses of wine per week     Comment: Occ.  . Drug Use: Not on file  . Sexual Activity: Not on file   Other Topics Concern  . Not on file    Social History Narrative    Past Surgical History  Procedure Laterality Date  . Tonsillectomy    . Right rtc repair    . Prostatectomy       Prescriptions prior to admission  Medication Sig Dispense Refill Last Dose  . aspirin 81 MG chewable tablet Chew 81 mg by mouth daily.   unknown at unknown   . atorvastatin (LIPITOR) 40 MG tablet Take 40 mg by mouth at bedtime.   unknown at unknown  . colchicine 0.6 MG tablet Take 0.6 mg by mouth daily.   unknown at unknown  . diltiazem (DILACOR XR) 240 MG 24 hr capsule Take 240 mg by mouth daily.   unknown at unknown  . hydrochlorothiazide (HYDRODIURIL) 25 MG tablet Take 25 mg by mouth daily.   unknown at unknown  . losartan (COZAAR) 50 MG tablet Take 100 mg by mouth daily.   unknown at unknown  . sucralfate (CARAFATE) 1 g tablet Take 1 g by mouth every 6 (six) hours as needed (for indigestion/heartburn).    unknown at unknown    Physical Exam: Blood pressure 162/98, pulse 108, temperature 97.6 F (36.4 C), temperature source Oral, resp. rate 20, height 5\' 10"  (1.778 m), weight 247 lb (112.038 kg), SpO2 98 %.   Constitutional:  oriented to person, place, and time. He appears well-developed and well-nourished. Mild distress due to pain.  HENT:  No nasal discharge.  Head: Normocephalic and atraumatic.  Eyes: Pupils are equal and round.  No discharge. Neck: Normal range of motion. Neck supple. No JVD present. No thyromegaly present.  Cardiovascular: Normal rate, regular rhythm, normal heart sounds. Exam reveals no gallop and no friction rub. There is a 3 out of 6 late peaking systolic murmur in the aortic area Pulmonary/Chest: Effort normal and breath sounds normal. No stridor. No respiratory distress.  no wheezes or rales.   Abdominal: Soft. Bowel sounds are normal. He exhibits no distension. There is no tenderness. There is no rebound and no guarding.  Musculoskeletal: Normal range of motion. No edema and no tenderness.  Neurological: Alert  and oriented to person, place, and time. Coordination normal.  Skin: Skin is warm and dry. No rash noted. He is not diaphoretic. No erythema. No pallor.  Psychiatric: Normal mood and affect.  behavior is normal. Judgment and thought content normal.    Labs:   Lab Results  Component Value Date   WBC 10.3 07/12/2015   HGB 14.7 07/12/2015   HCT 43.3 07/12/2015   MCV 76.5* 07/12/2015   PLT 216 07/12/2015    Recent Labs Lab 07/12/15 1334  NA 138  K 3.2*  CL 103  CO2 23  BUN 23*  CREATININE 1.54*  CALCIUM 9.6  GLUCOSE 146*   Lab Results  Component Value Date   TROPONINI 0.91* 07/12/2015      Radiology: No results found.  EKG: Independently reviewed by me and showed Sinus tachycardia with 2 mm of ST elevation in the inferior leads. Inferior Q waves  ASSESSMENT AND PLAN:   1. Acute inferior ST elevation myocardial infarction: Emergent cardiac catheterization was performed after discussing with the patient and his wife and explaining risks and benefits.  The patient was noted to have a systolic murmur suggestive of significant aortic stenosis before proceeding with cardiac catheterization. Thus, I elected to evaluate that before coronary angiography with a pullback across the aortic valve which confirmed severe aortic stenosis. Coronary angiography showed severe three-vessel and left main coronary artery disease with heavily calcified arteries. The culprit for his presentation is an occluded mid right coronary artery. However, the vessel is not suitable for PCI due to diffuse disease starting from the ostium. Also left to right collaterals were noted and EKG showed evidence of prior inferior infarct. Thus, this might be an acute on chronic presentation. A right heart catheterization was performed which showed mild pulmonary hypertension with moderately elevated filling pressures.  I placed an intra-aortic balloon pump with improvement in chest pain. The patient will require CABG plus  aortic valve replacement. The timing of surgery will depend on his symptoms and overall progress. If his symptoms are controlled with an intra-aortic balloon pump, it might be possible to wait until Monday. However, if he becomes unstable or his chest pain is not controlled, that it would be best to proceed with urgent CABG. Patient was given a heparin bolus and started on a heparin drip. Other than aspirin, he has not been given another antiplatelet medications.   I made arrangements for the patient to be transferred to [facility] and I called the report to Dr. Mayford Knife. The patient will require an echocardiogram today to evaluate his ejection fraction and aortic valve. Recommend cardiothoracic surgery consult.  Signed:  Lorine Bears MD, [facility] 07/12/2015, 5:11 PM

## 2015-07-12 NOTE — ED Provider Notes (Signed)
CSN: [id number]     Arrival date & time 07/12/15  1233 History   First MD Initiated Contact with Patient 07/12/15 1259    Nurses notes were reviewed. Chief Complaint  Patient presents with  . Shortness of Breath  . Chest Pain   Patient is here because of chest pain. Reports a nonradiating chest pain that is feels something sitting on his chest. When asked to quantify how much bad pain is this is not pain is his pressure. Does not really radiate. He's had 2 episodes of this in the last 2 weeks both times things got better after he threw up this time he was at a hardware store lows he started having this discomfort didn't go away she finally came in to be seen and evaluated. He has history of gastric acidity hypertension hyperlipidemia and gout no history of heart disease before the past. His father did die of myocardial infarction he is in the 81s. Past medical history tonsillectomy right rotator cuff repair and a prostatectomy. He does not smoke or never smoked.  (Consider location/radiation/quality/duration/timing/severity/associated sxs/prior Treatment) Patient is a 70 y.o. male presenting with shortness of breath and chest pain. The history is provided by the patient. No language interpreter was used.  Shortness of Breath Severity:  Severe Onset quality:  Sudden Timing:  Constant Progression:  Unchanged Relieved by:  Nothing Worsened by:  Nothing tried Ineffective treatments:  None tried Associated symptoms: chest pain   Risk factors: obesity   Chest Pain Pain quality: sharp   Pain severity:  Moderate Associated symptoms: shortness of breath     Past Medical History  Diagnosis Date  . Gastric acidity   . Hypertension   . Hypercholesteremia   . Gout    Past Surgical History  Procedure Laterality Date  . Tonsillectomy    . Right rtc repair    . Prostatectomy     History reviewed. No pertinent family history. Social History  Substance Use Topics  . Smoking status: Never  Smoker   . Smokeless tobacco: None  . Alcohol Use: 0.6 oz/week    1 Glasses of wine per week     Comment: Occ.    Review of Systems  Respiratory: Positive for shortness of breath.   Cardiovascular: Positive for chest pain.  All other systems reviewed and are negative.   Allergies  Review of patient's allergies indicates no known allergies.  Home Medications   Prior to Admission medications   Medication Sig Start Date End Date Taking? Authorizing Provider  aspirin 81 MG chewable tablet Chew 81 mg by mouth daily.    Historical Provider, MD  atorvastatin (LIPITOR) 40 MG tablet Take 40 mg by mouth at bedtime.    Historical Provider, MD  colchicine 0.6 MG tablet Take 0.6 mg by mouth daily.    Historical Provider, MD  diltiazem (DILACOR XR) 240 MG 24 hr capsule Take 240 mg by mouth daily.    Historical Provider, MD  hydrochlorothiazide (HYDRODIURIL) 25 MG tablet Take 25 mg by mouth daily.    Historical Provider, MD  losartan (COZAAR) 50 MG tablet Take 100 mg by mouth daily.    Historical Provider, MD  probenecid (BENEMID) 500 MG tablet Take 500 mg by mouth 2 (two) times daily.    Historical Provider, MD  sucralfate (CARAFATE) 1 g tablet Take 1 g by mouth every 6 (six) hours as needed (for indigestion/heartburn).     Historical Provider, MD   Meds Ordered and Administered this Visit  Medications  aspirin chewable tablet 324 mg (324 mg Oral Given 07/12/15 1252)    BP 144/108 mmHg  Pulse 117  Resp 24  Ht 5\' 11"  (1.803 m)  Wt 246 lb (111.585 kg)  BMI 34.33 kg/m2  SpO2 100% No data found.   Physical Exam  Constitutional: He is oriented to person, place, and time. He appears distressed.  HENT:  Head: Normocephalic and atraumatic.  Eyes: Conjunctivae are normal. Pupils are equal, round, and reactive to light.  Neck: Normal range of motion. Neck supple.  Cardiovascular: Normal rate and regular rhythm.   Pulmonary/Chest: Effort normal.  Abdominal: Soft.  Neurological: He is  alert and oriented to person, place, and time. No cranial nerve deficit.  Skin: Skin is warm. He is diaphoretic.  Psychiatric: His speech is normal. His mood appears anxious.  Vitals reviewed.   ED Course  Procedures (including critical care time)  Labs Review Labs Reviewed - No data to display  Imaging Review No results found.   Visual Acuity Review  Right Eye Distance:   Left Eye Distance:   Bilateral Distance:    Right Eye Near:   Left Eye Near:    Bilateral Near:         MDM   1. Acute anterior myocardial infarction [facility])     ED ECG REPORT I, Daymian Lill H, the attending physician, personally viewed and interpreted this ECG.   Date: 07/12/2015  EKG Time:12:44:45  Rate: 112  Rhythm: there are no previous tracings available for comparison, sinus tachycardia, ischemic changes noted in SL version V1 through V3 and with corresponding ST depression in 1 and aVL with Q waves now in 23 and  Axis: 77  Intervals:none  ST&T Change: ST changes in lead 1 and aVF with depression and elevation V1 through V3 and Q waves in II, III, and F aVF    Concern over possible acute myocardial infarction. Colostomy called. Patient given 4 baby aspirin saline lock started and given some nitroglycerin. Burnard Bunting RN contacted EMS and [facility] discussed with charge nurse in the ED Greg and cath lab is on standby. Patient requested it [facility] system but explained to him he is in [facility] I think that he is having acute myocardial infarction and we need to get him into the closest Cath Lab available and that EMS stomach upon decision by do believe he'll be going to [facility].  Hassan Rowan, MD 07/12/15 2037

## 2015-07-12 NOTE — ED Notes (Signed)
Transported to cath lab with RN, defib monitor, and cardiology md

## 2015-07-12 NOTE — ED Notes (Signed)
NTG SL at 1255

## 2015-07-12 NOTE — ED Notes (Signed)
EMS called to transport patient to [facility] ED.  [facility] ED charge nurse Tammy Sours notified of the patient having a STEMI and EKG was faxed to [phone].

## 2015-07-12 NOTE — ED Notes (Signed)
Pt presented to [facility] with shob and cp. Given 325mg  ASA and 2 NTG tab at urgent care.

## 2015-07-13 ENCOUNTER — Inpatient Hospital Stay (HOSPITAL_COMMUNITY): Payer: Medicare Other

## 2015-07-13 DIAGNOSIS — I2101 ST elevation (STEMI) myocardial infarction involving left main coronary artery: Secondary | ICD-10-CM

## 2015-07-13 DIAGNOSIS — E785 Hyperlipidemia, unspecified: Secondary | ICD-10-CM

## 2015-07-13 DIAGNOSIS — I350 Nonrheumatic aortic (valve) stenosis: Secondary | ICD-10-CM

## 2015-07-13 DIAGNOSIS — Z01810 Encounter for preprocedural cardiovascular examination: Secondary | ICD-10-CM

## 2015-07-13 DIAGNOSIS — I255 Ischemic cardiomyopathy: Secondary | ICD-10-CM

## 2015-07-13 LAB — URINALYSIS, ROUTINE W REFLEX MICROSCOPIC
Bilirubin Urine: NEGATIVE
Glucose, UA: NEGATIVE mg/dL
Hgb urine dipstick: NEGATIVE
Ketones, ur: NEGATIVE mg/dL
Leukocytes, UA: NEGATIVE
Nitrite: NEGATIVE
Protein, ur: NEGATIVE mg/dL
Specific Gravity, Urine: 1.026 (ref 1.005–1.030)
pH: 5.5 (ref 5.0–8.0)

## 2015-07-13 LAB — CBC
HCT: 38.8 % — ABNORMAL LOW (ref 39.0–52.0)
HEMOGLOBIN: 13.3 g/dL (ref 13.0–17.0)
MCH: 26 pg (ref 26.0–34.0)
MCHC: 34.3 g/dL (ref 30.0–36.0)
MCV: 75.9 fL — AB (ref 78.0–100.0)
PLATELETS: 197 10*3/uL (ref 150–400)
RBC: 5.11 MIL/uL (ref 4.22–5.81)
RDW: 14.7 % (ref 11.5–15.5)
WBC: 9.7 10*3/uL (ref 4.0–10.5)

## 2015-07-13 LAB — BASIC METABOLIC PANEL
ANION GAP: 10 (ref 5–15)
BUN: 16 mg/dL (ref 6–20)
CALCIUM: 8.9 mg/dL (ref 8.9–10.3)
CHLORIDE: 105 mmol/L (ref 101–111)
CO2: 25 mmol/L (ref 22–32)
Creatinine, Ser: 1.41 mg/dL — ABNORMAL HIGH (ref 0.61–1.24)
GFR calc non Af Amer: 49 mL/min — ABNORMAL LOW (ref >60)
GFR, EST AFRICAN AMERICAN: 56 mL/min — AB (ref >60)
GLUCOSE: 117 mg/dL — AB (ref 65–99)
POTASSIUM: 3.4 mmol/L — AB (ref 3.5–5.1)
Sodium: 140 mmol/L (ref 135–145)

## 2015-07-13 LAB — TROPONIN I: TROPONIN I: 43.1 ng/mL — AB (ref <0.031)

## 2015-07-13 LAB — LIPID PANEL
CHOLESTEROL: 108 mg/dL (ref 0–200)
HDL: 28 mg/dL — AB (ref >40)
LDL CALC: 64 mg/dL (ref 0–99)
TRIGLYCERIDES: 82 mg/dL (ref <150)
Total CHOL/HDL Ratio: 3.9 RATIO
VLDL: 16 mg/dL (ref 0–40)

## 2015-07-13 LAB — HEPARIN LEVEL (UNFRACTIONATED)
Heparin Unfractionated: <0.1 IU/mL — ABNORMAL LOW (ref 0.30–0.70)
Heparin Unfractionated: 0.16 IU/mL — ABNORMAL LOW (ref 0.30–0.70)

## 2015-07-13 LAB — PROTIME-INR
INR: 1.23 (ref 0.00–1.49)
Prothrombin Time: 15.6 seconds — ABNORMAL HIGH (ref 11.6–15.2)

## 2015-07-13 LAB — ECHOCARDIOGRAM COMPLETE
Height: 71 in
WEIGHTICAEL: 4035.3 [oz_av]

## 2015-07-13 MED ORDER — POTASSIUM CHLORIDE CRYS ER 20 MEQ PO TBCR
40.0000 meq | EXTENDED_RELEASE_TABLET | Freq: Once | ORAL | Status: AC
  Administered 2015-07-13: 40 meq via ORAL
  Filled 2015-07-13: qty 2

## 2015-07-13 NOTE — Progress Notes (Signed)
Patient ID: Wesley Martinez, male   DOB: August 13, 1945, 70 y.o.   MRN: [id number] TCTS DAILY ICU PROGRESS NOTE                   [address]            [place] [address]          [phone]     Procedure(s) (LRB): CORONARY ARTERY BYPASS GRAFTING (CABG) (N/A) AORTIC VALVE REPLACEMENT (AVR) (N/A) TRANSESOPHAGEAL ECHOCARDIOGRAM (TEE) (N/A)  Total Length of Stay:  LOS: 1 day   Subjective: No chest pain, respiratory status stable  Objective: Vital signs in last 24 hours: Temp:  [97.9 F (36.6 C)-98.7 F (37.1 C)] 98.7 F (37.1 C) (03/11 1200) Pulse Rate:  [80-181] 91 (03/11 1700) Cardiac Rhythm:  [-] Normal sinus rhythm (03/11 1200) Resp:  [0-29] 20 (03/11 1700) BP: (108-148)/(72-94) 110/72 mmHg (03/11 0600) SpO2:  [93 %-99 %] 94 % (03/11 1700) Weight:  [248 lb 7.3 oz (112.7 kg)-252 lb 3.3 oz (114.4 kg)] 248 lb 7.3 oz (112.7 kg) (03/11 0400)  Filed Weights   07/12/15 1900 07/13/15 0400  Weight: 252 lb 3.3 oz (114.4 kg) 248 lb 7.3 oz (112.7 kg)    Weight change:    Hemodynamic parameters for last 24 hours:    Intake/Output from previous day: 03/10 0701 - 03/11 0700 In: 330.7 [I.V.:110.7] Out: 1050 [Urine:1050]  Intake/Output this shift: Total I/O In: 122.5 [I.V.:122.5] Out: 575 [Urine:575]  Current Meds: Scheduled Meds: . aspirin EC  81 mg Oral QAC breakfast  . atorvastatin  40 mg Oral QAC breakfast  . sucralfate  1 g Oral QAC breakfast   Continuous Infusions: . heparin 1,300 Units/hr (07/13/15 1145)   PRN Meds:.acetaminophen, nitroGLYCERIN, ondansetron (ZOFRAN) IV  General appearance: alert, cooperative and no distress Neurologic: intact Heart: regular rate and rhythm, S1, S2 normal, no murmur, click, rub or gallop Lungs: diminished breath sounds bibasilar Abdomen: soft, non-tender; bowel sounds normal; no masses,  no organomegaly Extremities: extremities normal, atraumatic, no cyanosis or edema and Homans sign is negative, no sign of  DVT  Lab Results: CBC: Recent Labs  07/12/15 1334 07/13/15 0816  WBC 10.3 9.7  HGB 14.7 13.3  HCT 43.3 38.8*  PLT 216 197   BMET:  Recent Labs  07/12/15 1334 07/13/15 0816  NA 138 140  K 3.2* 3.4*  CL 103 105  CO2 23 25  GLUCOSE 146* 117*  BUN 23* 16  CREATININE 1.54* 1.41*  CALCIUM 9.6 8.9    PT/INR:  Recent Labs  07/13/15 0816  LABPROT 15.6*  INR 1.23   Radiology: Dg Chest Port 1 View  07/13/2015  CLINICAL DATA:  Myocardial infarction EXAM: PORTABLE CHEST 1 VIEW COMPARISON:  None. FINDINGS: Borderline cardiomegaly. Low volumes with mild atelectasis. No edema or effusion. No pneumothorax. Negative aortic and hilar contours. Aortic balloon pump tip projects just below the aortic arch. IMPRESSION: 1. Borderline cardiomegaly without failure. 2. Balloon pump tip projects just below the aortic arch. 3. Perihilar atelectasis. Electronically Signed   By: Marnee Spring M.D.   On: 07/13/2015 02:25   ECHO: LV EF: 30% - 35%  ------------------------------------------------------------------- Indications: Chest pain 786.51.  ------------------------------------------------------------------- History: PMH: Aortic valve disease. Risk factors: Hypertension. Obese. Hypercholesterolemia.  ------------------------------------------------------------------- Study Conclusions  - Left ventricle: The cavity size was normal. There was moderate  focal basal and mild concentric hypertrophy of the left  ventricle. Systolic function was moderately to severely reduced.  The estimated ejection fraction was  in the range of 30% to 35%.  There is hypokinesis in the basal and mid inferior, inferoseptal,  mid anteroseptal, inferolateral and apical inferior, septal and  lateral walls. Doppler parameters are consistent with restrictive  physiology, indicative of decreased left ventricular diastolic  compliance and/or increased left atrial pressure. Doppler   parameters are consistent with elevated ventricular end-diastolic  filling pressure. - Aortic valve: Valve mobility was restricted. Valve area (VTI):  1.15 cm^2. Valve area (Vmax): 0.9 cm^2. Valve area (Vmean): 1.06  cm^2. - Aortic root: The aortic root was normal in size. - Mitral valve: Structurally normal valve. There was mild  regurgitation. - Left atrium: The atrium was mildly dilated. - Inferior vena cava: The vessel was normal in size. - Pericardium, extracardiac: There was no pericardial effusion.  Impressions:  - LVEF 30-35% with regional wall motion abnormalities as described  above.  Aortic valve is severely thickened and calcified with severely  restricted leaflet opening. Transaortic gradients peak/mean 46/27  mmHg, consistent with moderate aortic stenosis, however with  significant LV systolic dysfunction most probably in the severe  range.  RVEF normal, normal RVSP.  Assessment/Plan: S/P Procedure(s) (LRB): CORONARY ARTERY BYPASS GRAFTING (CABG) (N/A) AORTIC VALVE REPLACEMENT (AVR) (N/A) TRANSESOPHAGEAL ECHOCARDIOGRAM (TEE) (N/A)  ECHO completed and discussed with Dr Delton See,  Plan CABG AVR Monday am Risks and options discussed with patient and his wife who is here today.    Delight Ovens 07/13/2015 5:35 PM

## 2015-07-13 NOTE — Progress Notes (Signed)
VASCULAR LAB PRELIMINARY  PRELIMINARY  PRELIMINARY  PRELIMINARY  Right Lower Extremity Vein Map    Right Great Saphenous Vein   Segment Diameter Comment  1. Origin 5.52mm   2. High Thigh 2.68mm   3. Mid Thigh 2.19mm   4. Low Thigh 2.66mm   5. At Knee 2.27mm   6. High Calf 2.13mm   7. Low Calf 2.68mm Branching in the calf  8. Ankle 2.63mm    mm    mm    mm       Left Great Saphenous Vein   Segment Diameter Comment  1. Origin 6.44mm   2. High Thigh 3.71mm   3. Mid Thigh 3.61mm   4. Low Thigh 3.53mm   5. At Knee 3.22mm   6. High Calf 2.30mm   7. Low Calf 2.77mm Branching in the calf.  8. Ankle 3.76mm    mm    mm    mm        Savahanna Almendariz, RVT 07/13/2015, 3:39 PM

## 2015-07-13 NOTE — Progress Notes (Signed)
RN observed pt's  UOP trending down over several hours. Pt asked if he felt urge to void due to external catheter. He denied needing to void. Bladder scan done with ~250cc shown. RN explained the possibility of a foley catheter especially with the presence of IABP. Pt adamantly refused catheter. RN educated pt on rationale of needing a catheter while IABP in place. Pt verbalized understanding and still refused. RN and Pt agreed to give him ~30-36min to void without the catheter present, and will discuss topic again. Pt voided ~225cc at 1830. RN reminded pt of the possible need later. Pt verbalized understanding.

## 2015-07-13 NOTE — Progress Notes (Signed)
ANTICOAGULATION CONSULT NOTE - Initial Consult  Pharmacy Consult for Heparin Indication: chest pain/ACS  No Known Allergies  Patient Measurements: Height: 5\' 11"  (180.3 cm) Weight: 248 lb 7.3 oz (112.7 kg) IBW/kg (Calculated) : 75.3 Heparin Dosing Weight: 100 kg  Vital Signs: Temp: 97.9 F (36.6 C) (03/11 0400) Temp Source: Oral (03/11 0400) BP: 110/72 mmHg (03/11 0600) Pulse Rate: 87 (03/11 0900)  Labs:  Recent Labs  07/12/15 1334 07/12/15 1955 07/12/15 2213 07/13/15 0136 07/13/15 0816  HGB 14.7  --   --   --  13.3  HCT 43.3  --   --   --  38.8*  PLT 216  --   --   --  197  LABPROT  --   --   --   --  15.6*  INR  --   --   --   --  1.23  HEPARINUNFRC  --   --  0.16*  --  <0.10*  CREATININE 1.54*  --   --   --  1.41*  TROPONINI 0.91* 21.94*  --  43.10* >65.00*    Estimated Creatinine Clearance: 61.4 mL/min (by C-G formula based on Cr of 1.41).   Medical History: Past Medical History  Diagnosis Date  . Gastric acidity   . Hypertension   . Hypercholesteremia   . Gout    Assessment: 70 year old male admitted with code STEMI and s/p emergent cardiac cath found to have 3 vessel and left main CAD with subsequent intra-aortic balloon pump placement and plan for CABG with aortic valve replacement likely Monday to continue IV heparin.  HL <0.10, Hgb/plt wnl. No line issues reported when speaking with RN  Goal of Therapy:  Heparin level 0.2 to 0.5 units/ml while IABP in place Monitor platelets by anticoagulation protocol: Yes   Plan:  Increase Heparin drip to 1300 units/hr. Heparin level in 6 hours  Daily heparin level and CBC while on therapy.  Monitor for signs and symptoms of bleeding.   Remi Haggard, PharmD Clinical Pharmacist- Resident Pager: [phone]   07/13/2015,10:00 AM

## 2015-07-13 NOTE — Progress Notes (Signed)
ANTICOAGULATION CONSULT NOTE - Initial Consult  Pharmacy Consult for Heparin Indication: chest pain/ACS  No Known Allergies  Patient Measurements: Height: 5\' 11"  (180.3 cm) Weight: 248 lb 7.3 oz (112.7 kg) IBW/kg (Calculated) : 75.3 Heparin Dosing Weight: 100 kg  Vital Signs: Temp: 98.7 F (37.1 C) (03/11 1200) Temp Source: Oral (03/11 1200) Pulse Rate: 94 (03/11 1900)  Labs:  Recent Labs  07/12/15 1334 07/12/15 1955 07/12/15 2213 07/13/15 0136 07/13/15 0816 07/13/15 1615  HGB 14.7  --   --   --  13.3  --   HCT 43.3  --   --   --  38.8*  --   PLT 216  --   --   --  197  --   LABPROT  --   --   --   --  15.6*  --   INR  --   --   --   --  1.23  --   HEPARINUNFRC  --   --  0.16*  --  <0.10* 0.16*  CREATININE 1.54*  --   --   --  1.41*  --   TROPONINI 0.91* 21.94*  --  43.10* >65.00*  --     Estimated Creatinine Clearance: 61.4 mL/min (by C-G formula based on Cr of 1.41).   Medical History: Past Medical History  Diagnosis Date  . Gastric acidity   . Hypertension   . Hypercholesteremia   . Gout    Assessment: 70 year old male admitted with code STEMI and s/p emergent cardiac cath found to have 3 vessel and left main CAD with subsequent intra-aortic balloon pump placement and plan for CABG with aortic valve replacement likely Monday to continue IV heparin.  HL <0.10, Hgb/plt wnl. No line issues reported when speaking with RN  Goal of Therapy:  Heparin level 0.2 to 0.5 units/ml while IABP in place Monitor platelets by anticoagulation protocol: Yes   Plan:  Increase Heparin drip to 1300 units/hr. Heparin level in 6 hours  Daily heparin level and CBC while on therapy.  Monitor for signs and symptoms of bleeding.   Remi Haggard, PharmD Clinical Pharmacist- Resident Pager: [phone]   07/13/2015,7:33 PM   Addendum -Increase heparin to 1500 units/hr -Daily HL, CBC -Check level in 8 hours   Baldemar Friday  07/13/2015 7:34 PM

## 2015-07-13 NOTE — Progress Notes (Signed)
Patient Name: Barton Canlas Date of Encounter: 07/13/2015  Active Problems:   STEMI (ST elevation myocardial infarction) (HCC)   Length of Stay: 1  SUBJECTIVE  The patient denies chest pain or SOB.   CURRENT MEDS . aspirin EC  81 mg Oral QAC breakfast  . atorvastatin  40 mg Oral QAC breakfast  . sucralfate  1 g Oral QAC breakfast   OBJECTIVE  Filed Vitals:   07/13/15 0700 07/13/15 0800 07/13/15 0900 07/13/15 1000  BP:      Pulse: 95 88 87 86  Temp:      TempSrc:      Resp: 29 18 16 16   Height:      Weight:      SpO2: 96% 93% 97% 97%    Intake/Output Summary (Last 24 hours) at 07/13/15 1048 Last data filed at 07/13/15 1015  Gross per 24 hour  Intake 362.65 ml  Output   1475 ml  Net -1112.35 ml   Filed Weights   07/12/15 1900 07/13/15 0400  Weight: 252 lb 3.3 oz (114.4 kg) 248 lb 7.3 oz (112.7 kg)   PHYSICAL EXAM  General: Pleasant, NAD. Neuro: Alert and oriented X 3. Moves all extremities spontaneously. Psych: Normal affect. HEENT:  Normal  Neck: Supple without bruits or JVD. Lungs:  Resp regular and unlabored, CTA. Heart: RRR no s3, s4, 5/6 systolic murmur, no S2 Abdomen: Soft, non-tender, non-distended, BS + x 4.  Extremities: No clubbing, cyanosis or edema. DP/PT/Radials 2+ and equal bilaterally.  Accessory Clinical Findings  CBC  Recent Labs  07/12/15 1334 07/13/15 0816  WBC 10.3 9.7  HGB 14.7 13.3  HCT 43.3 38.8*  MCV 76.5* 75.9*  PLT 216 197   Basic Metabolic Panel  Recent Labs  07/12/15 1334 07/13/15 0816  NA 138 140  K 3.2* 3.4*  CL 103 105  CO2 23 25  GLUCOSE 146* 117*  BUN 23* 16  CREATININE 1.54* 1.41*  CALCIUM 9.6 8.9    Recent Labs  07/12/15 1955 07/13/15 0136 07/13/15 0816  TROPONINI 21.94* 43.10* >65.00*    Recent Labs  07/13/15 0136  CHOL 108  HDL 28*  LDLCALC 64  TRIG 82  CHOLHDL 3.9   Dg Chest Port 1 View  07/13/2015  CLINICAL DATA:  Myocardial infarction EXAM: PORTABLE CHEST 1 VIEW COMPARISON:   None. FINDINGS: Borderline cardiomegaly. Low volumes with mild atelectasis. No edema or effusion. No pneumothorax. Negative aortic and hilar contours. Aortic balloon pump tip projects just below the aortic arch. IMPRESSION: 1. Borderline cardiomegaly without failure. 2. Balloon pump tip projects just below the aortic arch. 3. Perihilar atelectasis. Electronically Signed   By: Marnee Spring M.D.   On: 07/13/2015 02:25   TELE: SR  cath: 07/12/2015  LM lesion, 70% stenosed.  Ost LAD to Prox LAD lesion, 80% stenosed.  Mid LAD lesion, 90% stenosed.  Prox Cx lesion, 90% stenosed.  Ost RCA to Prox RCA lesion, 95% stenosed.  Mid RCA lesion, 100% stenosed.  1st Mrg lesion, 99% stenosed.  1. Severe three-vessel and left main coronary artery disease. The coronary arteries are overall heavily calcified. The culprit as an occluded mid right coronary artery which is heavily calcified with left-to-right collaterals. The LAD also wraps around the apex. The vessel is not optimal for PCI.  2. Severe aortic stenosis with a peak to peak gradient of 30 mmHg and calculated valve area of 0.8 cm  3. Severely elevated left ventricular end-diastolic pressure. Left ventricular angiography was not performed  as the patient developed left bundle branch block with left ventricular engagement and LVEDP was severely elevated.  4. Right heart catheterization showed only mild pulmonary hypertension with a pulmonary pressure of 36/22. Wedge pressure was 24 mmHg and cardiac output was 4.4 with a cardiac index of 1.93.  5. Successful intra-aortic balloon pump placement.     ASSESSMENT AND PLAN  1. Acute inferior ST elevation myocardial infarction:   Severe three-vessel and left main coronary artery disease. The coronary arteries are overall heavily calcified. The culprit as an occluded mid right coronary artery which is heavily calcified with left-to-right collaterals. The LAD also wraps around the apex. The vessel  is not optimal for PCI. Severe aortic stenosis with a peak to peak gradient of 30 mmHg and calculated valve area of 0.8 cm  Evaluated by Dr Tyrone Sage, planned CABG and AVR for today of Monday, the patient is NPO, hemodynamically stable on IABP.  Echocardiogram shows LVEF 30-35%, grade 3 diastolic dysfunction, normal RVEF, normal RV systolic function and aortic valve that is severely thickened and calcified with severely restricted leaflet opening. Transaortic gradients peak/mean 46/27  mmHg, consistent with moderate aortic stenosis, however with  significant LV systolic dysfunction most probably in the severe  range.  Dr Tyrone Sage is currently in the OR, he will talk to the patient later today.   On asa and atorvastatin, no BB as BP too low.  2. Ischemic cardiomypathy - as above  3. HLP - on atorvastatin.    Signed, Lars Masson MD, [facility] 07/13/2015

## 2015-07-14 LAB — BLOOD GAS, ARTERIAL
Acid-base deficit: 0.2 mmol/L (ref 0.0–2.0)
Bicarbonate: 23.1 mEq/L (ref 20.0–24.0)
Drawn by: 441371
FIO2: 0.21
O2 Saturation: 95.4 %
Patient temperature: 97.9
TCO2: 24.1 mmol/L (ref 0–100)
pCO2 arterial: 31.6 mmHg — ABNORMAL LOW (ref 35.0–45.0)
pH, Arterial: 7.475 — ABNORMAL HIGH (ref 7.350–7.450)
pO2, Arterial: 70.5 mmHg — ABNORMAL LOW (ref 80.0–100.0)

## 2015-07-14 LAB — COMPREHENSIVE METABOLIC PANEL
ALT: 44 U/L (ref 17–63)
AST: 93 U/L — ABNORMAL HIGH (ref 15–41)
Albumin: 3.3 g/dL — ABNORMAL LOW (ref 3.5–5.0)
Alkaline Phosphatase: 62 U/L (ref 38–126)
Anion gap: 10 (ref 5–15)
BUN: 17 mg/dL (ref 6–20)
CO2: 22 mmol/L (ref 22–32)
Calcium: 8.5 mg/dL — ABNORMAL LOW (ref 8.9–10.3)
Chloride: 106 mmol/L (ref 101–111)
Creatinine, Ser: 1.31 mg/dL — ABNORMAL HIGH (ref 0.61–1.24)
GFR calc Af Amer: >60 mL/min (ref >60)
GFR calc non Af Amer: 53 mL/min — ABNORMAL LOW (ref >60)
Glucose, Bld: 111 mg/dL — ABNORMAL HIGH (ref 65–99)
Potassium: 3.4 mmol/L — ABNORMAL LOW (ref 3.5–5.1)
Sodium: 138 mmol/L (ref 135–145)
Total Bilirubin: 1 mg/dL (ref 0.3–1.2)
Total Protein: 5.7 g/dL — ABNORMAL LOW (ref 6.5–8.1)

## 2015-07-14 LAB — PROTIME-INR
INR: 1.27 (ref 0.00–1.49)
Prothrombin Time: 16 seconds — ABNORMAL HIGH (ref 11.6–15.2)

## 2015-07-14 LAB — CBC
HEMATOCRIT: 37.4 % — AB (ref 39.0–52.0)
Hemoglobin: 12.5 g/dL — ABNORMAL LOW (ref 13.0–17.0)
MCH: 25.6 pg — ABNORMAL LOW (ref 26.0–34.0)
MCHC: 33.4 g/dL (ref 30.0–36.0)
MCV: 76.6 fL — ABNORMAL LOW (ref 78.0–100.0)
PLATELETS: 164 10*3/uL (ref 150–400)
RBC: 4.88 MIL/uL (ref 4.22–5.81)
RDW: 15 % (ref 11.5–15.5)
WBC: 9.9 10*3/uL (ref 4.0–10.5)

## 2015-07-14 LAB — ABO/RH: ABO/RH(D): O POS

## 2015-07-14 LAB — HEPARIN LEVEL (UNFRACTIONATED)
HEPARIN UNFRACTIONATED: 0.29 [IU]/mL — AB (ref 0.30–0.70)
Heparin Unfractionated: 0.29 IU/mL — ABNORMAL LOW (ref 0.30–0.70)

## 2015-07-14 MED ORDER — EPINEPHRINE HCL 1 MG/ML IJ SOLN
0.0000 ug/min | INTRAVENOUS | Status: DC
  Filled 2015-07-14: qty 4

## 2015-07-14 MED ORDER — NITROGLYCERIN IN D5W 200-5 MCG/ML-% IV SOLN
2.0000 ug/min | INTRAVENOUS | Status: DC
  Administered 2015-07-15: 5 ug/min via INTRAVENOUS
  Filled 2015-07-14: qty 250

## 2015-07-14 MED ORDER — VANCOMYCIN HCL 10 G IV SOLR
1500.0000 mg | INTRAVENOUS | Status: DC
  Administered 2015-07-15: 1500 mg via INTRAVENOUS
  Filled 2015-07-14: qty 1500

## 2015-07-14 MED ORDER — SODIUM CHLORIDE 0.9 % IV SOLN
INTRAVENOUS | Status: DC
  Administered 2015-07-15: 1 [IU]/h via INTRAVENOUS
  Filled 2015-07-14: qty 2.5

## 2015-07-14 MED ORDER — MAGNESIUM SULFATE 50 % IJ SOLN
40.0000 meq | INTRAMUSCULAR | Status: DC
  Filled 2015-07-14: qty 10

## 2015-07-14 MED ORDER — POTASSIUM CHLORIDE 2 MEQ/ML IV SOLN
80.0000 meq | INTRAVENOUS | Status: DC
  Filled 2015-07-14: qty 40

## 2015-07-14 MED ORDER — TEMAZEPAM 15 MG PO CAPS
15.0000 mg | ORAL_CAPSULE | Freq: Once | ORAL | Status: AC | PRN
  Administered 2015-07-14: 15 mg via ORAL
  Filled 2015-07-14: qty 1

## 2015-07-14 MED ORDER — PHENYLEPHRINE HCL 10 MG/ML IJ SOLN
30.0000 ug/min | INTRAVENOUS | Status: DC
  Administered 2015-07-15: 15 ug/min via INTRAVENOUS
  Filled 2015-07-14: qty 2

## 2015-07-14 MED ORDER — DEXTROSE 5 % IV SOLN
750.0000 mg | INTRAVENOUS | Status: DC
  Filled 2015-07-14: qty 750

## 2015-07-14 MED ORDER — CHLORHEXIDINE GLUCONATE 0.12 % MT SOLN
15.0000 mL | Freq: Once | OROMUCOSAL | Status: AC
  Administered 2015-07-15: 15 mL via OROMUCOSAL
  Filled 2015-07-14: qty 15

## 2015-07-14 MED ORDER — POTASSIUM CHLORIDE CRYS ER 20 MEQ PO TBCR
40.0000 meq | EXTENDED_RELEASE_TABLET | Freq: Once | ORAL | Status: AC
  Administered 2015-07-14: 40 meq via ORAL
  Filled 2015-07-14: qty 2

## 2015-07-14 MED ORDER — METOPROLOL TARTRATE 12.5 MG HALF TABLET
12.5000 mg | ORAL_TABLET | Freq: Once | ORAL | Status: AC
  Administered 2015-07-15: 12.5 mg via ORAL
  Filled 2015-07-14: qty 1

## 2015-07-14 MED ORDER — CHLORHEXIDINE GLUCONATE CLOTH 2 % EX PADS
6.0000 | MEDICATED_PAD | Freq: Once | CUTANEOUS | Status: AC
  Administered 2015-07-14: 6 via TOPICAL

## 2015-07-14 MED ORDER — SODIUM CHLORIDE 0.9 % IV SOLN
INTRAVENOUS | Status: DC
  Administered 2015-07-15: 69 mL/h via INTRAVENOUS
  Filled 2015-07-14: qty 40

## 2015-07-14 MED ORDER — CHLORHEXIDINE GLUCONATE CLOTH 2 % EX PADS
6.0000 | MEDICATED_PAD | Freq: Once | CUTANEOUS | Status: AC
  Administered 2015-07-15: 6 via TOPICAL

## 2015-07-14 MED ORDER — SODIUM CHLORIDE 0.9 % IV SOLN
INTRAVENOUS | Status: DC
  Filled 2015-07-14: qty 30

## 2015-07-14 MED ORDER — PLASMA-LYTE 148 IV SOLN
INTRAVENOUS | Status: AC
  Administered 2015-07-15: 500 mL
  Filled 2015-07-14: qty 2.5

## 2015-07-14 MED ORDER — DOPAMINE-DEXTROSE 3.2-5 MG/ML-% IV SOLN
0.0000 ug/kg/min | INTRAVENOUS | Status: DC
  Filled 2015-07-14: qty 250

## 2015-07-14 MED ORDER — DEXMEDETOMIDINE HCL IN NACL 400 MCG/100ML IV SOLN
0.1000 ug/kg/h | INTRAVENOUS | Status: DC
  Administered 2015-07-15: 15:00:00 via INTRAVENOUS
  Administered 2015-07-15: .2 ug/kg/h via INTRAVENOUS
  Filled 2015-07-14: qty 100

## 2015-07-14 MED ORDER — CARVEDILOL 3.125 MG PO TABS
3.1250 mg | ORAL_TABLET | Freq: Two times a day (BID) | ORAL | Status: DC
  Administered 2015-07-14 (×2): 3.125 mg via ORAL
  Filled 2015-07-14 (×2): qty 1

## 2015-07-14 MED ORDER — CEFUROXIME SODIUM 1.5 G IJ SOLR
1.5000 g | INTRAMUSCULAR | Status: DC
  Administered 2015-07-15: 1.5 g via INTRAVENOUS
  Administered 2015-07-15: .75 g via INTRAVENOUS
  Filled 2015-07-14: qty 1.5

## 2015-07-14 NOTE — Progress Notes (Signed)
Patient Name: Wesley Martinez Date of Encounter: 07/14/2015  Active Problems:   STEMI (ST elevation myocardial infarction) (HCC)   Length of Stay: 2  SUBJECTIVE  The patient denies chest pain or SOB.   CURRENT MEDS . aspirin EC  81 mg Oral QAC breakfast  . atorvastatin  40 mg Oral QAC breakfast  . sucralfate  1 g Oral QAC breakfast   OBJECTIVE  Filed Vitals:   07/14/15 0747 07/14/15 0800 07/14/15 0900 07/14/15 1000  BP:      Pulse: 94 92 95 92  Temp: 98 F (36.7 C)     TempSrc: Oral     Resp: 23 17 20 18   Height:      Weight:      SpO2: 99% 98% 98% 96%    Intake/Output Summary (Last 24 hours) at 07/14/15 1051 Last data filed at 07/14/15 0900  Gross per 24 hour  Intake 565.37 ml  Output    760 ml  Net -194.63 ml   Filed Weights   07/12/15 1900 07/13/15 0400 07/14/15 0500  Weight: 252 lb 3.3 oz (114.4 kg) 248 lb 7.3 oz (112.7 kg) 246 lb 11.1 oz (111.9 kg)   PHYSICAL EXAM  General: Pleasant, NAD. Neuro: Alert and oriented X 3. Moves all extremities spontaneously. Psych: Normal affect. HEENT:  Normal  Neck: Supple without bruits or JVD. Lungs:  Resp regular and unlabored, CTA. Heart: RRR no s3, s4, 5/6 systolic murmur, no S2 Abdomen: Soft, non-tender, non-distended, BS + x 4.  Extremities: No clubbing, cyanosis or edema. DP/PT/Radials 2+ and equal bilaterally. R groin no bleeding at the IABP insertion site, good peripheral pulses  Accessory Clinical Findings  CBC  Recent Labs  07/13/15 0816 07/14/15 0531  WBC 9.7 9.9  HGB 13.3 12.5*  HCT 38.8* 37.4*  MCV 75.9* 76.6*  PLT 197 164   Basic Metabolic Panel  Recent Labs  07/13/15 0816 07/14/15 0531  NA 140 138  K 3.4* 3.4*  CL 105 106  CO2 25 22  GLUCOSE 117* 111*  BUN 16 17  CREATININE 1.41* 1.31*  CALCIUM 8.9 8.5*    Recent Labs  07/12/15 1955 07/13/15 0136 07/13/15 0816  TROPONINI 21.94* 43.10* >65.00*    Recent Labs  07/13/15 0136  CHOL 108  HDL 28*  LDLCALC 64  TRIG 82    CHOLHDL 3.9   Dg Chest Port 1 View  07/13/2015  CLINICAL DATA:  Myocardial infarction EXAM: PORTABLE CHEST 1 VIEW COMPARISON:  None. FINDINGS: Borderline cardiomegaly. Low volumes with mild atelectasis. No edema or effusion. No pneumothorax. Negative aortic and hilar contours. Aortic balloon pump tip projects just below the aortic arch. IMPRESSION: 1. Borderline cardiomegaly without failure. 2. Balloon pump tip projects just below the aortic arch. 3. Perihilar atelectasis. Electronically Signed   By: Marnee Spring M.D.   On: 07/13/2015 02:25   TELE: SR  cath: 07/12/2015  LM lesion, 70% stenosed.  Ost LAD to Prox LAD lesion, 80% stenosed.  Mid LAD lesion, 90% stenosed.  Prox Cx lesion, 90% stenosed.  Ost RCA to Prox RCA lesion, 95% stenosed.  Mid RCA lesion, 100% stenosed.  1st Mrg lesion, 99% stenosed.  1. Severe three-vessel and left main coronary artery disease. The coronary arteries are overall heavily calcified. The culprit as an occluded mid right coronary artery which is heavily calcified with left-to-right collaterals. The LAD also wraps around the apex. The vessel is not optimal for PCI.  2. Severe aortic stenosis with a peak to peak  gradient of 30 mmHg and calculated valve area of 0.8 cm  3. Severely elevated left ventricular end-diastolic pressure. Left ventricular angiography was not performed as the patient developed left bundle branch block with left ventricular engagement and LVEDP was severely elevated.  4. Right heart catheterization showed only mild pulmonary hypertension with a pulmonary pressure of 36/22. Wedge pressure was 24 mmHg and cardiac output was 4.4 with a cardiac index of 1.93.  5. Successful intra-aortic balloon pump placement.     ASSESSMENT AND PLAN  1. Acute inferior ST elevation myocardial infarction:   Severe three-vessel and left main coronary artery disease. The coronary arteries are overall heavily calcified. The culprit as an occluded  mid right coronary artery which is heavily calcified with left-to-right collaterals. The LAD also wraps around the apex. The vessel is not optimal for PCI. Severe aortic stenosis with a peak to peak gradient of 30 mmHg and calculated valve area of 0.8 cm  Evaluated by Dr Tyrone Sage, planned CABG and AVR for Monday, hemodynamically stable on IABP.  Echocardiogram shows LVEF 30-35%, grade 3 diastolic dysfunction, normal RVEF, normal RV systolic function and aortic valve that is severely thickened and calcified with severely restricted leaflet opening. Transaortic gradients peak/mean 46/27  mmHg, consistent with moderate aortic stenosis, however with  significant LV systolic dysfunction most probably in the severe  range.  On asa and atorvastatin, BP improved, we will add a low dose carvedilol 3.125 mg po BID.  2. Ischemic cardiomypathy - as above  3. HLP - on atorvastatin.   4. Hypokalemia - replace  Signed, Lars Masson MD, [facility] 07/14/2015

## 2015-07-14 NOTE — Progress Notes (Signed)
Patient ID: Wesley Martinez, male   DOB: 06-Jul-1945, 70 y.o.   MRN: [id number] TCTS DAILY ICU PROGRESS NOTE                   [address]            [place] [address]          [phone]     Procedure(s) (LRB): CORONARY ARTERY BYPASS GRAFTING (CABG) (N/A) AORTIC VALVE REPLACEMENT (AVR) (N/A) TRANSESOPHAGEAL ECHOCARDIOGRAM (TEE) (N/A)  Total Length of Stay:  LOS: 2 days   Subjective: Comfortable, no chest pain or sob  Objective: Vital signs in last 24 hours: Temp:  [97.9 F (36.6 C)-98.3 F (36.8 C)] 97.9 F (36.6 C) (03/12 1221) Pulse Rate:  [61-96] 94 (03/12 1300) Cardiac Rhythm:  [-] Normal sinus rhythm (03/12 0800) Resp:  [12-27] 21 (03/12 1300) SpO2:  [94 %-99 %] 96 % (03/12 1300) Weight:  [246 lb 11.1 oz (111.9 kg)] 246 lb 11.1 oz (111.9 kg) (03/12 0500)  Filed Weights   07/12/15 1900 07/13/15 0400 07/14/15 0500  Weight: 252 lb 3.3 oz (114.4 kg) 248 lb 7.3 oz (112.7 kg) 246 lb 11.1 oz (111.9 kg)    Weight change: -5 lb 8.2 oz (-2.5 kg)   Hemodynamic parameters for last 24 hours:    Intake/Output from previous day: 03/11 0701 - 03/12 0700 In: 327.4 [I.V.:327.4] Out: 1165 [Urine:1165]  Intake/Output this shift: Total I/O In: 330 [P.O.:240; I.V.:90] Out: 320 [Urine:320]  Current Meds: Scheduled Meds: . aspirin EC  81 mg Oral QAC breakfast  . atorvastatin  40 mg Oral QAC breakfast  . carvedilol  3.125 mg Oral BID WC  . sucralfate  1 g Oral QAC breakfast   Continuous Infusions: . heparin 1,500 Units/hr (07/13/15 1934)   PRN Meds:.acetaminophen, nitroGLYCERIN, ondansetron (ZOFRAN) IV  General appearance: alert, cooperative and no distress Neurologic: intact Heart: systolic murmur: holosystolic 3/6, crescendo at 2nd left intercostal space Lungs: diminished breath sounds bibasilar Abdomen: soft, non-tender; bowel sounds normal; no masses,  no organomegaly Extremities: extremities normal, atraumatic, no cyanosis or edema and Homans sign  is negative, no sign of DVT IAB in place, feet viable   Lab Results: CBC: Recent Labs  07/13/15 0816 07/14/15 0531  WBC 9.7 9.9  HGB 13.3 12.5*  HCT 38.8* 37.4*  PLT 197 164   BMET:  Recent Labs  07/13/15 0816 07/14/15 0531  NA 140 138  K 3.4* 3.4*  CL 105 106  CO2 25 22  GLUCOSE 117* 111*  BUN 16 17  CREATININE 1.41* 1.31*  CALCIUM 8.9 8.5*    PT/INR:  Recent Labs  07/14/15 0531  LABPROT 16.0*  INR 1.27   Radiology: No results found.   Assessment/Plan: S/P Procedure(s) (LRB): CORONARY ARTERY BYPASS GRAFTING (CABG) (N/A) AORTIC VALVE REPLACEMENT (AVR) (N/A) TRANSESOPHAGEAL ECHOCARDIOGRAM (TEE) (N/A) For CABG and AVR in am, I have discussed procedure with patient and his wife, prefers a tissue valve  Which is appropriate. The goals risks and alternatives of the planned surgical procedure CABG and AVR have been discussed with the patient in detail. The risks of the procedure including death, infection, stroke, myocardial infarction, bleeding, blood transfusion , Heart Block and need for pace maker have all been discussed specifically.  I have quoted Wesley Martinez a 5 % of perioperative mortality and a complication rate as high as 40 %. The patient's questions have been answered.Wesley Martinez is willing  to proceed with the planned procedure.  In addition  to other potential risks and complications from the surgery, I have made the patient aware of the recent [facility] Health Advisory concerning the risk of infection by Myocobacterium chimaera related to the use of [employer]ert 3T heater-cooler equipment during cardiac surgery. I discussed with the patient the low risk of infection, as well as our compliance with the most current FDA recommendations to minimize infection and testing of all devices for contamination. The patient has been made aware of the limited alternatives to immediately replacing the current equipment. The patient has been informed regarding the risks associated with  waiting to proceed with needed surgery and that such risks are greater than the risk of infection related to the use of the heater-cooler device. I did make the patient aware that after careful review of the patients having cardiac surgery at [facility] we have no evidence that heater/cooler related infections have occurred at [facility]. We discussed that this is a slow-growing bacterium, such that it can take some period of time for symptoms to develop.    Wesley Martinez 07/14/2015 2:05 PM

## 2015-07-14 NOTE — Progress Notes (Signed)
ANTICOAGULATION CONSULT NOTE - Follow-up Consult  Pharmacy Consult for Heparin Indication: chest pain/ACS  No Known Allergies  Patient Measurements: Height: 5\' 11"  (180.3 cm) Weight: 246 lb 11.1 oz (111.9 kg) IBW/kg (Calculated) : 75.3 Heparin Dosing Weight: 100 kg  Vital Signs: Temp: 98.1 F (36.7 C) (03/12 0436) Temp Source: Oral (03/12 0436) Pulse Rate: 91 (03/12 0436)  Labs:  Recent Labs  07/12/15 1334 07/12/15 1955  07/13/15 0136 07/13/15 0816 07/13/15 1615 07/14/15 0531  HGB 14.7  --   --   --  13.3  --   --   HCT 43.3  --   --   --  38.8*  --   --   PLT 216  --   --   --  197  --   --   LABPROT  --   --   --   --  15.6*  --  16.0*  INR  --   --   --   --  1.23  --  1.27  HEPARINUNFRC  --   --   < >  --  <0.10* 0.16* 0.29*  CREATININE 1.54*  --   --   --  1.41*  --   --   TROPONINI 0.91* 21.94*  --  43.10* >65.00*  --   --   < > = values in this interval not displayed.  Estimated Creatinine Clearance: 61.1 mL/min (by C-G formula based on Cr of 1.41).   Assessment: 70 year old male admitted with code STEMI and s/p emergent cardiac cath found to have 3 vessel and left main CAD with subsequent intra-aortic balloon pump placement and plan for CABG with aortic valve replacement likely Monday to continue IV heparin. Heparin level therapeutic for lower IABP goal. No bleeding noted  Goal of Therapy:  Heparin level 0.2 to 0.5 units/ml while IABP in place Monitor platelets by anticoagulation protocol: Yes   Plan:  Continue heparin drip at 1300 units/hr. F/u confirmatory heparin level in 6 hours  Christoper Fabian, PharmD, BCPS Clinical pharmacist, pager [phone] 07/14/2015,6:22 AM

## 2015-07-14 NOTE — Progress Notes (Signed)
ANTICOAGULATION CONSULT NOTE - Initial Consult  Pharmacy Consult for Heparin Indication: chest pain/ACS  No Known Allergies  Patient Measurements: Height: 5\' 11"  (180.3 cm) Weight: 246 lb 11.1 oz (111.9 kg) IBW/kg (Calculated) : 75.3 Heparin Dosing Weight: 100 kg  Vital Signs: Temp: 98 F (36.7 C) (03/12 0747) Temp Source: Oral (03/12 0747) Pulse Rate: 92 (03/12 1100)  Labs:  Recent Labs  07/12/15 1334 07/12/15 1955  07/13/15 0136 07/13/15 0816 07/13/15 1615 07/14/15 0531  HGB 14.7  --   --   --  13.3  --  12.5*  HCT 43.3  --   --   --  38.8*  --  37.4*  PLT 216  --   --   --  197  --  164  LABPROT  --   --   --   --  15.6*  --  16.0*  INR  --   --   --   --  1.23  --  1.27  HEPARINUNFRC  --   --   < >  --  <0.10* 0.16* 0.29*  CREATININE 1.54*  --   --   --  1.41*  --  1.31*  TROPONINI 0.91* 21.94*  --  43.10* >65.00*  --   --   < > = values in this interval not displayed.  Estimated Creatinine Clearance: 65.8 mL/min (by C-G formula based on Cr of 1.31).   Medical History: Past Medical History  Diagnosis Date  . Gastric acidity   . Hypertension   . Hypercholesteremia   . Gout    Assessment: 70 year old male admitted with code STEMI and s/p emergent cardiac cath found to have 3 vessel and left main CAD with subsequent intra-aortic balloon pump placement and plan for CABG with aortic valve replacement likely Monday to continue IV heparin.  HL 0.29, (therapeutic x2), Hgb 12.5, plt wnl. No bleeding noted  Goal of Therapy:  Heparin level 0.2 to 0.5 units/ml while IABP in place Monitor platelets by anticoagulation protocol: Yes   Plan:  Continue Heparin drip at 1500 units/hr. Daily heparin level and CBC while on therapy.  Monitor for signs and symptoms of bleeding.   Remi Haggard, PharmD Clinical Pharmacist- Resident Pager: [phone]   07/14/2015,12:06 PM

## 2015-07-14 NOTE — Anesthesia Preprocedure Evaluation (Addendum)
Anesthesia Evaluation  Patient identified by MRN, date of birth, ID band Patient awake    Reviewed: Allergy & Precautions, H&P , NPO status , Patient's Chart, lab work & pertinent test results  Airway Mallampati: III  TM Distance: >3 FB Neck ROM: Full    Dental no notable dental hx. (+) Teeth Intact, Dental Advisory Given   Pulmonary neg pulmonary ROS,    Pulmonary exam normal breath sounds clear to auscultation       Cardiovascular hypertension, Pt. on medications + CAD  + Valvular Problems/Murmurs AS  Rhythm:Regular Rate:Normal     Neuro/Psych negative neurological ROS  negative psych ROS   GI/Hepatic Neg liver ROS, GERD  Medicated and Controlled,  Endo/Other  negative endocrine ROS  Renal/GU negative Renal ROS  negative genitourinary   Musculoskeletal   Abdominal   Peds  Hematology negative hematology ROS (+)   Anesthesia Other Findings   Reproductive/Obstetrics negative OB ROS                           Anesthesia Physical Anesthesia Plan  ASA: IV  Anesthesia Plan: General   Post-op Pain Management:    Induction: Intravenous  Airway Management Planned: Oral ETT  Additional Equipment: Arterial line, CVP, PA Cath, TEE and Ultrasound Guidance Line Placement  Intra-op Plan:   Post-operative Plan: Post-operative intubation/ventilation  Informed Consent: I have reviewed the patients History and Physical, chart, labs and discussed the procedure including the risks, benefits and alternatives for the proposed anesthesia with the patient or authorized representative who has indicated his/her understanding and acceptance.   Dental advisory given  Plan Discussed with: CRNA  Anesthesia Plan Comments:         Anesthesia Quick Evaluation

## 2015-07-15 ENCOUNTER — Inpatient Hospital Stay (HOSPITAL_COMMUNITY): Payer: Medicare Other | Admitting: Certified Registered"

## 2015-07-15 ENCOUNTER — Inpatient Hospital Stay (HOSPITAL_COMMUNITY): Payer: Medicare Other

## 2015-07-15 ENCOUNTER — Encounter (HOSPITAL_COMMUNITY)
Admission: AD | Disposition: A | Discharge status: Home / Self Care | Payer: Self-pay | Source: Other Acute Inpatient Hospital | Attending: Cardiothoracic Surgery

## 2015-07-15 ENCOUNTER — Encounter: Payer: Self-pay | Admitting: Cardiovascular Disease

## 2015-07-15 DIAGNOSIS — I350 Nonrheumatic aortic (valve) stenosis: Secondary | ICD-10-CM

## 2015-07-15 DIAGNOSIS — I2510 Atherosclerotic heart disease of native coronary artery without angina pectoris: Secondary | ICD-10-CM | POA: Diagnosis present

## 2015-07-15 HISTORY — PX: AORTIC VALVE REPLACEMENT: SHX41

## 2015-07-15 HISTORY — PX: TEE WITHOUT CARDIOVERSION: SHX5443

## 2015-07-15 HISTORY — PX: CORONARY ARTERY BYPASS GRAFT: SHX141

## 2015-07-15 LAB — POCT I-STAT, CHEM 8
BUN: 17 mg/dL (ref 6–20)
BUN: 17 mg/dL (ref 6–20)
BUN: 14 mg/dL (ref 6–20)
BUN: 17 mg/dL (ref 6–20)
BUN: 18 mg/dL (ref 6–20)
BUN: 17 mg/dL (ref 6–20)
BUN: 19 mg/dL (ref 6–20)
CALCIUM ION: 1.15 mmol/L (ref 1.13–1.30)
CALCIUM ION: 1.1 mmol/L — AB (ref 1.13–1.30)
CALCIUM ION: 0.84 mmol/L — AB (ref 1.13–1.30)
CHLORIDE: 106 mmol/L (ref 101–111)
CHLORIDE: 102 mmol/L (ref 101–111)
CHLORIDE: 105 mmol/L (ref 101–111)
CHLORIDE: 108 mmol/L (ref 101–111)
CREATININE: 1 mg/dL (ref 0.61–1.24)
Calcium, Ion: 1.08 mmol/L — ABNORMAL LOW (ref 1.13–1.30)
Calcium, Ion: 1.09 mmol/L — ABNORMAL LOW (ref 1.13–1.30)
Calcium, Ion: 1.07 mmol/L — ABNORMAL LOW (ref 1.13–1.30)
Calcium, Ion: 1.06 mmol/L — ABNORMAL LOW (ref 1.13–1.30)
Chloride: 109 mmol/L (ref 101–111)
Chloride: 101 mmol/L (ref 101–111)
Chloride: 104 mmol/L (ref 101–111)
Creatinine, Ser: 1 mg/dL (ref 0.61–1.24)
Creatinine, Ser: 1 mg/dL (ref 0.61–1.24)
Creatinine, Ser: 0.7 mg/dL (ref 0.61–1.24)
Creatinine, Ser: 1 mg/dL (ref 0.61–1.24)
Creatinine, Ser: 1.1 mg/dL (ref 0.61–1.24)
Creatinine, Ser: 1.2 mg/dL (ref 0.61–1.24)
GLUCOSE: 108 mg/dL — AB (ref 65–99)
GLUCOSE: 126 mg/dL — AB (ref 65–99)
GLUCOSE: 164 mg/dL — AB (ref 65–99)
Glucose, Bld: 103 mg/dL — ABNORMAL HIGH (ref 65–99)
Glucose, Bld: 156 mg/dL — ABNORMAL HIGH (ref 65–99)
Glucose, Bld: 145 mg/dL — ABNORMAL HIGH (ref 65–99)
Glucose, Bld: 103 mg/dL — ABNORMAL HIGH (ref 65–99)
HCT: 35 % — ABNORMAL LOW (ref 39.0–52.0)
HCT: 33 % — ABNORMAL LOW (ref 39.0–52.0)
HCT: 26 % — ABNORMAL LOW (ref 39.0–52.0)
HEMATOCRIT: 26 % — AB (ref 39.0–52.0)
HEMATOCRIT: 26 % — AB (ref 39.0–52.0)
HEMATOCRIT: 29 % — AB (ref 39.0–52.0)
HEMATOCRIT: 29 % — AB (ref 39.0–52.0)
HEMOGLOBIN: 11.2 g/dL — AB (ref 13.0–17.0)
HEMOGLOBIN: 8.8 g/dL — AB (ref 13.0–17.0)
HEMOGLOBIN: 9.9 g/dL — AB (ref 13.0–17.0)
Hemoglobin: 11.9 g/dL — ABNORMAL LOW (ref 13.0–17.0)
Hemoglobin: 8.8 g/dL — ABNORMAL LOW (ref 13.0–17.0)
Hemoglobin: 8.8 g/dL — ABNORMAL LOW (ref 13.0–17.0)
Hemoglobin: 9.9 g/dL — ABNORMAL LOW (ref 13.0–17.0)
POTASSIUM: 5.3 mmol/L — AB (ref 3.5–5.1)
POTASSIUM: 4.5 mmol/L (ref 3.5–5.1)
POTASSIUM: 4.5 mmol/L (ref 3.5–5.1)
Potassium: 3.7 mmol/L (ref 3.5–5.1)
Potassium: 3.8 mmol/L (ref 3.5–5.1)
Potassium: 4.1 mmol/L (ref 3.5–5.1)
Potassium: 4.5 mmol/L (ref 3.5–5.1)
SODIUM: 141 mmol/L (ref 135–145)
SODIUM: 137 mmol/L (ref 135–145)
SODIUM: 136 mmol/L (ref 135–145)
Sodium: 140 mmol/L (ref 135–145)
Sodium: 137 mmol/L (ref 135–145)
Sodium: 137 mmol/L (ref 135–145)
Sodium: 141 mmol/L (ref 135–145)
TCO2: 25 mmol/L (ref 0–100)
TCO2: 27 mmol/L (ref 0–100)
TCO2: 26 mmol/L (ref 0–100)
TCO2: 25 mmol/L (ref 0–100)
TCO2: 25 mmol/L (ref 0–100)
TCO2: 22 mmol/L (ref 0–100)
TCO2: 20 mmol/L (ref 0–100)

## 2015-07-15 LAB — POCT I-STAT 3, ART BLOOD GAS (G3+)
ACID-BASE DEFICIT: 2 mmol/L (ref 0.0–2.0)
ACID-BASE DEFICIT: 4 mmol/L — AB (ref 0.0–2.0)
ACID-BASE DEFICIT: 6 mmol/L — AB (ref 0.0–2.0)
Acid-base deficit: 4 mmol/L — ABNORMAL HIGH (ref 0.0–2.0)
Acid-base deficit: 6 mmol/L — ABNORMAL HIGH (ref 0.0–2.0)
Acid-base deficit: 6 mmol/L — ABNORMAL HIGH (ref 0.0–2.0)
BICARBONATE: 21.4 meq/L (ref 20.0–24.0)
BICARBONATE: 19.4 meq/L — AB (ref 20.0–24.0)
Bicarbonate: 23.3 mEq/L (ref 20.0–24.0)
Bicarbonate: 23.9 mEq/L (ref 20.0–24.0)
Bicarbonate: 21.3 mEq/L (ref 20.0–24.0)
Bicarbonate: 19.6 mEq/L — ABNORMAL LOW (ref 20.0–24.0)
Bicarbonate: 19.7 mEq/L — ABNORMAL LOW (ref 20.0–24.0)
O2 SAT: 100 %
O2 SAT: 100 %
O2 SAT: 94 %
O2 SAT: 95 %
O2 Saturation: 100 %
O2 Saturation: 97 %
O2 Saturation: 93 %
PCO2 ART: 36.9 mmHg (ref 35.0–45.0)
PCO2 ART: 40.4 mmHg (ref 35.0–45.0)
PCO2 ART: 39.2 mmHg (ref 35.0–45.0)
PCO2 ART: 39.7 mmHg (ref 35.0–45.0)
PCO2 ART: 38.8 mmHg (ref 35.0–45.0)
PH ART: 7.364 (ref 7.350–7.450)
PH ART: 7.42 (ref 7.350–7.450)
PH ART: 7.332 — AB (ref 7.350–7.450)
PH ART: 7.633 — AB (ref 7.350–7.450)
PH ART: 7.308 — AB (ref 7.350–7.450)
PH ART: 7.312 — AB (ref 7.350–7.450)
PO2 ART: 191 mmHg — AB (ref 80.0–100.0)
PO2 ART: 83 mmHg (ref 80.0–100.0)
PO2 ART: 76 mmHg — AB (ref 80.0–100.0)
Patient temperature: 37.5
Patient temperature: 37.8
TCO2: 24 mmol/L (ref 0–100)
TCO2: 25 mmol/L (ref 0–100)
TCO2: 23 mmol/L (ref 0–100)
TCO2: 22 mmol/L (ref 0–100)
TCO2: 21 mmol/L (ref 0–100)
TCO2: 21 mmol/L (ref 0–100)
TCO2: 21 mmol/L (ref 0–100)
pCO2 arterial: 40.8 mmHg (ref 35.0–45.0)
pCO2 arterial: 16.4 mmHg — CL (ref 35.0–45.0)
pH, Arterial: 7.309 — ABNORMAL LOW (ref 7.350–7.450)
pO2, Arterial: 371 mmHg — ABNORMAL HIGH (ref 80.0–100.0)
pO2, Arterial: 440 mmHg — ABNORMAL HIGH (ref 80.0–100.0)
pO2, Arterial: 20 mmHg — CL (ref 80.0–100.0)
pO2, Arterial: 100 mmHg (ref 80.0–100.0)

## 2015-07-15 LAB — BASIC METABOLIC PANEL
Anion gap: 10 (ref 5–15)
BUN: 15 mg/dL (ref 6–20)
CO2: 22 mmol/L (ref 22–32)
Calcium: 8.6 mg/dL — ABNORMAL LOW (ref 8.9–10.3)
Chloride: 108 mmol/L (ref 101–111)
Creatinine, Ser: 1.37 mg/dL — ABNORMAL HIGH (ref 0.61–1.24)
GFR calc Af Amer: 58 mL/min — ABNORMAL LOW (ref >60)
GFR calc non Af Amer: 50 mL/min — ABNORMAL LOW (ref >60)
Glucose, Bld: 104 mg/dL — ABNORMAL HIGH (ref 65–99)
Potassium: 3.7 mmol/L (ref 3.5–5.1)
Sodium: 140 mmol/L (ref 135–145)

## 2015-07-15 LAB — MAGNESIUM: Magnesium: 3 mg/dL — ABNORMAL HIGH (ref 1.7–2.4)

## 2015-07-15 LAB — CBC
HCT: 36.3 % — ABNORMAL LOW (ref 39.0–52.0)
HCT: 30.9 % — ABNORMAL LOW (ref 39.0–52.0)
HEMATOCRIT: 35.2 % — AB (ref 39.0–52.0)
HEMOGLOBIN: 12.2 g/dL — AB (ref 13.0–17.0)
HEMOGLOBIN: 11.7 g/dL — AB (ref 13.0–17.0)
Hemoglobin: 10.2 g/dL — ABNORMAL LOW (ref 13.0–17.0)
MCH: 26.2 pg (ref 26.0–34.0)
MCH: 25.6 pg — AB (ref 26.0–34.0)
MCH: 25.4 pg — ABNORMAL LOW (ref 26.0–34.0)
MCHC: 33.6 g/dL (ref 30.0–36.0)
MCHC: 33.2 g/dL (ref 30.0–36.0)
MCHC: 33 g/dL (ref 30.0–36.0)
MCV: 77.9 fL — ABNORMAL LOW (ref 78.0–100.0)
MCV: 77 fL — ABNORMAL LOW (ref 78.0–100.0)
MCV: 77.1 fL — ABNORMAL LOW (ref 78.0–100.0)
PLATELETS: 143 10*3/uL — AB (ref 150–400)
Platelets: 75 10*3/uL — ABNORMAL LOW (ref 150–400)
Platelets: 91 10*3/uL — ABNORMAL LOW (ref 150–400)
RBC: 4.66 MIL/uL (ref 4.22–5.81)
RBC: 4.57 MIL/uL (ref 4.22–5.81)
RBC: 4.01 MIL/uL — ABNORMAL LOW (ref 4.22–5.81)
RDW: 15 % (ref 11.5–15.5)
RDW: 14.8 % (ref 11.5–15.5)
RDW: 14.8 % (ref 11.5–15.5)
WBC: 9.5 10*3/uL (ref 4.0–10.5)
WBC: 13.2 10*3/uL — ABNORMAL HIGH (ref 4.0–10.5)
WBC: 11.3 10*3/uL — ABNORMAL HIGH (ref 4.0–10.5)

## 2015-07-15 LAB — POCT I-STAT 4, (NA,K, GLUC, HGB,HCT)
GLUCOSE: 114 mg/dL — AB (ref 65–99)
HEMATOCRIT: 34 % — AB (ref 39.0–52.0)
HEMOGLOBIN: 11.6 g/dL — AB (ref 13.0–17.0)
POTASSIUM: 4.3 mmol/L (ref 3.5–5.1)
Sodium: 140 mmol/L (ref 135–145)

## 2015-07-15 LAB — HEMOGLOBIN AND HEMATOCRIT, BLOOD
HCT: 26.3 % — ABNORMAL LOW (ref 39.0–52.0)
Hemoglobin: 8.7 g/dL — ABNORMAL LOW (ref 13.0–17.0)

## 2015-07-15 LAB — PREPARE RBC (CROSSMATCH)

## 2015-07-15 LAB — CREATININE, SERUM
Creatinine, Ser: 1.22 mg/dL (ref 0.61–1.24)
GFR calc Af Amer: >60 mL/min (ref >60)
GFR calc non Af Amer: 58 mL/min — ABNORMAL LOW (ref >60)

## 2015-07-15 LAB — HEMOGLOBIN A1C
HEMOGLOBIN A1C: 6.2 % — AB (ref 4.8–5.6)
MEAN PLASMA GLUCOSE: 131 mg/dL

## 2015-07-15 LAB — HEPARIN LEVEL (UNFRACTIONATED): HEPARIN UNFRACTIONATED: 0.13 [IU]/mL — AB (ref 0.30–0.70)

## 2015-07-15 LAB — PROTIME-INR
INR: 1.81 — ABNORMAL HIGH (ref 0.00–1.49)
PROTHROMBIN TIME: 20.9 s — AB (ref 11.6–15.2)

## 2015-07-15 LAB — PLATELET COUNT: Platelets: 83 10*3/uL — ABNORMAL LOW (ref 150–400)

## 2015-07-15 LAB — APTT: aPTT: 36 seconds (ref 24–37)

## 2015-07-15 SURGERY — CORONARY ARTERY BYPASS GRAFTING (CABG)
Anesthesia: General | Site: Chest

## 2015-07-15 MED ORDER — ROCURONIUM BROMIDE 100 MG/10ML IV SOLN
INTRAVENOUS | Status: DC | PRN
  Administered 2015-07-15: 50 mg via INTRAVENOUS
  Administered 2015-07-15: 100 mg via INTRAVENOUS

## 2015-07-15 MED ORDER — HEMOSTATIC AGENTS (NO CHARGE) OPTIME
TOPICAL | Status: DC | PRN
  Administered 2015-07-15: 1 via TOPICAL

## 2015-07-15 MED ORDER — INSULIN REGULAR BOLUS VIA INFUSION
0.0000 [IU] | Freq: Three times a day (TID) | INTRAVENOUS | Status: DC
  Filled 2015-07-15: qty 10

## 2015-07-15 MED ORDER — PANTOPRAZOLE SODIUM 40 MG PO TBEC
40.0000 mg | DELAYED_RELEASE_TABLET | Freq: Every day | ORAL | Status: DC
  Administered 2015-07-17: 40 mg via ORAL
  Filled 2015-07-15: qty 1

## 2015-07-15 MED ORDER — SODIUM CHLORIDE 0.9 % IV SOLN: 250.0000 mL | INTRAVENOUS | Status: DC

## 2015-07-15 MED ORDER — ROCURONIUM BROMIDE 50 MG/5ML IV SOLN
INTRAVENOUS | Status: AC
  Filled 2015-07-15: qty 2

## 2015-07-15 MED ORDER — LACTATED RINGERS IV SOLN
INTRAVENOUS | Status: DC | PRN
  Administered 2015-07-15 (×3): via INTRAVENOUS

## 2015-07-15 MED ORDER — SODIUM CHLORIDE 0.9% FLUSH
3.0000 mL | Freq: Two times a day (BID) | INTRAVENOUS | Status: DC
Start: 2015-07-16 — End: 2015-07-18
  Administered 2015-07-16 – 2015-07-17 (×4): 3 mL via INTRAVENOUS

## 2015-07-15 MED ORDER — MIDAZOLAM HCL 5 MG/5ML IJ SOLN
INTRAMUSCULAR | Status: DC | PRN
  Administered 2015-07-15 (×2): 2 mg via INTRAVENOUS
  Administered 2015-07-15 (×2): 1 mg via INTRAVENOUS
  Administered 2015-07-15: 3 mg via INTRAVENOUS
  Administered 2015-07-15: 1 mg via INTRAVENOUS

## 2015-07-15 MED ORDER — PROPOFOL 10 MG/ML IV BOLUS
INTRAVENOUS | Status: DC | PRN
  Administered 2015-07-15 (×2): 50 mg via INTRAVENOUS

## 2015-07-15 MED ORDER — VECURONIUM BROMIDE 10 MG IV SOLR
INTRAVENOUS | Status: AC
  Filled 2015-07-15: qty 10

## 2015-07-15 MED ORDER — 0.9 % SODIUM CHLORIDE (POUR BTL) OPTIME
TOPICAL | Status: DC | PRN
  Administered 2015-07-15: 6000 mL

## 2015-07-15 MED ORDER — LACTATED RINGERS IV SOLN: 500.0000 mL | Freq: Once | INTRAVENOUS | Status: DC | PRN

## 2015-07-15 MED ORDER — VECURONIUM BROMIDE 10 MG IV SOLR
INTRAVENOUS | Status: DC | PRN
  Administered 2015-07-15 (×4): 5 mg via INTRAVENOUS

## 2015-07-15 MED ORDER — ACETAMINOPHEN 500 MG PO TABS
1000.0000 mg | ORAL_TABLET | Freq: Four times a day (QID) | ORAL | Status: DC
  Administered 2015-07-16 – 2015-07-17 (×2): 1000 mg via ORAL
  Filled 2015-07-15 (×2): qty 2

## 2015-07-15 MED ORDER — ARTIFICIAL TEARS OP OINT
TOPICAL_OINTMENT | OPHTHALMIC | Status: DC | PRN
  Administered 2015-07-15: 1 via OPHTHALMIC

## 2015-07-15 MED ORDER — DOCUSATE SODIUM 100 MG PO CAPS
200.0000 mg | ORAL_CAPSULE | Freq: Every day | ORAL | Status: DC
  Administered 2015-07-16 – 2015-07-17 (×2): 200 mg via ORAL
  Filled 2015-07-15 (×2): qty 2

## 2015-07-15 MED ORDER — FENTANYL CITRATE (PF) 250 MCG/5ML IJ SOLN
INTRAMUSCULAR | Status: AC
  Filled 2015-07-15: qty 5

## 2015-07-15 MED ORDER — HEPARIN SODIUM (PORCINE) 1000 UNIT/ML IJ SOLN
INTRAMUSCULAR | Status: AC
  Filled 2015-07-15: qty 2

## 2015-07-15 MED ORDER — SUCRALFATE 1 G PO TABS
1.0000 g | ORAL_TABLET | Freq: Every day | ORAL | Status: DC
  Administered 2015-07-18 – 2015-07-20 (×3): 1 g via ORAL
  Filled 2015-07-15 (×4): qty 1

## 2015-07-15 MED ORDER — METOPROLOL TARTRATE 12.5 MG HALF TABLET
12.5000 mg | ORAL_TABLET | Freq: Two times a day (BID) | ORAL | Status: DC
  Administered 2015-07-16 – 2015-07-17 (×4): 12.5 mg via ORAL
  Filled 2015-07-15 (×4): qty 1

## 2015-07-15 MED ORDER — DEXMEDETOMIDINE HCL IN NACL 200 MCG/50ML IV SOLN: 0.0000 ug/kg/h | INTRAVENOUS | Status: DC

## 2015-07-15 MED ORDER — VECURONIUM BROMIDE 10 MG IV SOLR
INTRAVENOUS | Status: AC
  Filled 2015-07-15: qty 20

## 2015-07-15 MED ORDER — ACETAMINOPHEN 160 MG/5ML PO SOLN: 1000.0000 mg | Freq: Four times a day (QID) | ORAL | Status: DC

## 2015-07-15 MED ORDER — MIDAZOLAM HCL 10 MG/2ML IJ SOLN
INTRAMUSCULAR | Status: AC
  Filled 2015-07-15: qty 2

## 2015-07-15 MED ORDER — FENTANYL CITRATE (PF) 100 MCG/2ML IJ SOLN
INTRAMUSCULAR | Status: DC | PRN
  Administered 2015-07-15: 950 ug via INTRAVENOUS
  Administered 2015-07-15: 50 ug via INTRAVENOUS
  Administered 2015-07-15 (×2): 250 ug via INTRAVENOUS

## 2015-07-15 MED ORDER — SODIUM CHLORIDE 0.9% FLUSH: 3.0000 mL | INTRAVENOUS | Status: DC | PRN

## 2015-07-15 MED ORDER — TRAMADOL HCL 50 MG PO TABS
50.0000 mg | ORAL_TABLET | ORAL | Status: DC | PRN
  Administered 2015-07-17 – 2015-07-18 (×3): 100 mg via ORAL
  Filled 2015-07-15 (×3): qty 2

## 2015-07-15 MED ORDER — EPHEDRINE SULFATE 50 MG/ML IJ SOLN
INTRAMUSCULAR | Status: AC
  Filled 2015-07-15: qty 1

## 2015-07-15 MED ORDER — MAGNESIUM SULFATE 4 GM/100ML IV SOLN
4.0000 g | Freq: Once | INTRAVENOUS | Status: AC
  Administered 2015-07-15: 4 g via INTRAVENOUS
  Filled 2015-07-15: qty 100

## 2015-07-15 MED ORDER — LACTATED RINGERS IV SOLN: INTRAVENOUS | Status: DC

## 2015-07-15 MED ORDER — FAMOTIDINE IN NACL 20-0.9 MG/50ML-% IV SOLN: 20.0000 mg | Freq: Two times a day (BID) | INTRAVENOUS | Status: DC

## 2015-07-15 MED ORDER — ARTIFICIAL TEARS OP OINT
TOPICAL_OINTMENT | OPHTHALMIC | Status: AC
  Filled 2015-07-15: qty 3.5

## 2015-07-15 MED ORDER — PHENYLEPHRINE HCL 10 MG/ML IJ SOLN
0.0000 ug/min | INTRAVENOUS | Status: DC
  Filled 2015-07-15: qty 2

## 2015-07-15 MED ORDER — CHLORHEXIDINE GLUCONATE 0.12% ORAL RINSE (MEDLINE KIT)
15.0000 mL | Freq: Two times a day (BID) | OROMUCOSAL | Status: DC
  Administered 2015-07-15 – 2015-07-16 (×2): 15 mL via OROMUCOSAL

## 2015-07-15 MED ORDER — STERILE WATER FOR INJECTION IJ SOLN
INTRAMUSCULAR | Status: AC
  Filled 2015-07-15: qty 10

## 2015-07-15 MED ORDER — ONDANSETRON HCL 4 MG/2ML IJ SOLN
4.0000 mg | Freq: Four times a day (QID) | INTRAMUSCULAR | Status: DC | PRN
  Administered 2015-07-17: 4 mg via INTRAVENOUS
  Filled 2015-07-15: qty 2

## 2015-07-15 MED ORDER — LIDOCAINE HCL (CARDIAC) 20 MG/ML IV SOLN
INTRAVENOUS | Status: AC
  Filled 2015-07-15: qty 5

## 2015-07-15 MED ORDER — PROTAMINE SULFATE 10 MG/ML IV SOLN
INTRAVENOUS | Status: DC | PRN
  Administered 2015-07-15: 350 mg via INTRAVENOUS

## 2015-07-15 MED ORDER — ACETAMINOPHEN 160 MG/5ML PO SOLN: 650.0000 mg | Freq: Once | ORAL | Status: AC

## 2015-07-15 MED ORDER — MIDAZOLAM HCL 2 MG/2ML IJ SOLN: 2.0000 mg | INTRAMUSCULAR | Status: DC | PRN

## 2015-07-15 MED ORDER — PHENYLEPHRINE 40 MCG/ML (10ML) SYRINGE FOR IV PUSH (FOR BLOOD PRESSURE SUPPORT)
PREFILLED_SYRINGE | INTRAVENOUS | Status: AC
  Filled 2015-07-15: qty 10

## 2015-07-15 MED ORDER — METOPROLOL TARTRATE 1 MG/ML IV SOLN: 2.5000 mg | INTRAVENOUS | Status: DC | PRN

## 2015-07-15 MED ORDER — DOPAMINE-DEXTROSE 3.2-5 MG/ML-% IV SOLN: 1.0000 ug/kg/min | INTRAVENOUS | Status: DC

## 2015-07-15 MED ORDER — FENTANYL CITRATE (PF) 250 MCG/5ML IJ SOLN
INTRAMUSCULAR | Status: AC
  Filled 2015-07-15: qty 25

## 2015-07-15 MED ORDER — DOPAMINE-DEXTROSE 1.6-5 MG/ML-% IV SOLN
INTRAVENOUS | Status: DC | PRN
  Administered 2015-07-15: 5 ug/kg/min via INTRAVENOUS

## 2015-07-15 MED ORDER — ANTISEPTIC ORAL RINSE SOLUTION (CORINZ)
7.0000 mL | Freq: Four times a day (QID) | OROMUCOSAL | Status: DC
  Administered 2015-07-15 – 2015-07-18 (×9): 7 mL via OROMUCOSAL

## 2015-07-15 MED ORDER — HEPARIN SODIUM (PORCINE) 1000 UNIT/ML IJ SOLN
INTRAMUSCULAR | Status: DC | PRN
  Administered 2015-07-15: 50000 [IU] via INTRAVENOUS

## 2015-07-15 MED ORDER — SODIUM CHLORIDE 0.9 % IJ SOLN
OROMUCOSAL | Status: DC | PRN
  Administered 2015-07-15 (×3): 4 mL via TOPICAL

## 2015-07-15 MED ORDER — SODIUM CHLORIDE 0.9 % IV SOLN
INTRAVENOUS | Status: DC
  Administered 2015-07-15: 2.3 [IU]/h via INTRAVENOUS
  Filled 2015-07-15 (×2): qty 2.5

## 2015-07-15 MED ORDER — HEPARIN SODIUM (PORCINE) 1000 UNIT/ML IJ SOLN
INTRAMUSCULAR | Status: AC
  Filled 2015-07-15: qty 1

## 2015-07-15 MED ORDER — METOPROLOL TARTRATE 25 MG/10 ML ORAL SUSPENSION: 12.5000 mg | Freq: Two times a day (BID) | ORAL | Status: DC

## 2015-07-15 MED ORDER — SODIUM CHLORIDE 0.9 % IJ SOLN
INTRAMUSCULAR | Status: AC
  Filled 2015-07-15: qty 20

## 2015-07-15 MED ORDER — ASPIRIN EC 325 MG PO TBEC
325.0000 mg | DELAYED_RELEASE_TABLET | Freq: Every day | ORAL | Status: DC
  Administered 2015-07-17: 325 mg via ORAL
  Filled 2015-07-15 (×2): qty 1

## 2015-07-15 MED ORDER — SODIUM CHLORIDE 0.45 % IV SOLN
INTRAVENOUS | Status: DC | PRN
  Administered 2015-07-15: 16:00:00 via INTRAVENOUS

## 2015-07-15 MED ORDER — OXYCODONE HCL 5 MG PO TABS
5.0000 mg | ORAL_TABLET | ORAL | Status: DC | PRN
  Administered 2015-07-16: 10 mg via ORAL
  Filled 2015-07-15: qty 2

## 2015-07-15 MED ORDER — VANCOMYCIN HCL IN DEXTROSE 1-5 GM/200ML-% IV SOLN
1000.0000 mg | Freq: Once | INTRAVENOUS | Status: AC
  Administered 2015-07-15: 1000 mg via INTRAVENOUS
  Filled 2015-07-15: qty 200

## 2015-07-15 MED ORDER — NITROGLYCERIN IN D5W 200-5 MCG/ML-% IV SOLN: 0.0000 ug/min | INTRAVENOUS | Status: DC

## 2015-07-15 MED ORDER — BISACODYL 5 MG PO TBEC: 5.0000 mg | DELAYED_RELEASE_TABLET | Freq: Once | ORAL | Status: DC

## 2015-07-15 MED ORDER — MORPHINE SULFATE (PF) 2 MG/ML IV SOLN: 1.0000 mg | INTRAVENOUS | Status: DC | PRN

## 2015-07-15 MED ORDER — PROPOFOL 10 MG/ML IV BOLUS
INTRAVENOUS | Status: AC
  Filled 2015-07-15: qty 20

## 2015-07-15 MED ORDER — DEXMEDETOMIDINE HCL IN NACL 400 MCG/100ML IV SOLN
0.0000 ug/kg/h | INTRAVENOUS | Status: DC
Start: 2015-07-15 — End: 2015-07-18
  Administered 2015-07-15: 0.7 ug/kg/h via INTRAVENOUS
  Filled 2015-07-15: qty 100

## 2015-07-15 MED ORDER — MORPHINE SULFATE (PF) 2 MG/ML IV SOLN
2.0000 mg | INTRAVENOUS | Status: DC | PRN
  Administered 2015-07-16 – 2015-07-17 (×13): 2 mg via INTRAVENOUS
  Filled 2015-07-15: qty 1
  Filled 2015-07-15: qty 2
  Filled 2015-07-15 (×10): qty 1

## 2015-07-15 MED ORDER — ALBUMIN HUMAN 5 % IV SOLN
250.0000 mL | INTRAVENOUS | Status: AC | PRN
  Administered 2015-07-15 (×4): 250 mL via INTRAVENOUS
  Filled 2015-07-15 (×2): qty 250

## 2015-07-15 MED ORDER — POTASSIUM CHLORIDE 10 MEQ/50ML IV SOLN: 10.0000 meq | INTRAVENOUS | Status: AC

## 2015-07-15 MED ORDER — SODIUM CHLORIDE 0.9 % IV SOLN
INTRAVENOUS | Status: DC
  Administered 2015-07-15: 16:00:00 via INTRAVENOUS

## 2015-07-15 MED ORDER — ASPIRIN 81 MG PO CHEW: 324.0000 mg | CHEWABLE_TABLET | Freq: Every day | ORAL | Status: DC

## 2015-07-15 MED ORDER — ACETAMINOPHEN 650 MG RE SUPP
650.0000 mg | Freq: Once | RECTAL | Status: AC
  Administered 2015-07-15: 650 mg via RECTAL

## 2015-07-15 MED ORDER — CHLORHEXIDINE GLUCONATE 0.12 % MT SOLN
15.0000 mL | OROMUCOSAL | Status: AC
  Administered 2015-07-15: 15 mL via OROMUCOSAL

## 2015-07-15 MED ORDER — DEXTROSE 5 % IV SOLN
1.5000 g | Freq: Two times a day (BID) | INTRAVENOUS | Status: AC
  Administered 2015-07-16 – 2015-07-17 (×4): 1.5 g via INTRAVENOUS
  Filled 2015-07-15 (×5): qty 1.5

## 2015-07-15 MED FILL — Magnesium Sulfate Inj 50%: INTRAMUSCULAR | Qty: 10 | Status: AC

## 2015-07-15 MED FILL — Potassium Chloride Inj 2 mEq/ML: INTRAVENOUS | Qty: 40 | Status: AC

## 2015-07-15 MED FILL — Heparin Sodium (Porcine) Inj 1000 Unit/ML: INTRAMUSCULAR | Qty: 30 | Status: AC

## 2015-07-15 SURGICAL SUPPLY — 93 items
ADAPTER CARDIO PERF ANTE/RETRO (ADAPTER) ×3 IMPLANT
APPLICATOR COTTON TIP 6IN STRL (MISCELLANEOUS) IMPLANT
BAG DECANTER FOR FLEXI CONT (MISCELLANEOUS) ×3 IMPLANT
BANDAGE ACE 4X5 VEL STRL LF (GAUZE/BANDAGES/DRESSINGS) ×3 IMPLANT
BANDAGE ACE 6X5 VEL STRL LF (GAUZE/BANDAGES/DRESSINGS) ×3 IMPLANT
BANDAGE ELASTIC 4 VELCRO ST LF (GAUZE/BANDAGES/DRESSINGS) ×3 IMPLANT
BANDAGE ELASTIC 6 VELCRO ST LF (GAUZE/BANDAGES/DRESSINGS) ×3 IMPLANT
BLADE STERNUM SYSTEM 6 (BLADE) ×3 IMPLANT
BLADE SURG 11 STRL SS (BLADE) ×3 IMPLANT
BLADE SURG 15 STRL LF DISP TIS (BLADE) ×2 IMPLANT
BLADE SURG 15 STRL SS (BLADE) ×1
BNDG GAUZE ELAST 4 BULKY (GAUZE/BANDAGES/DRESSINGS) ×3 IMPLANT
BOOT SUTURE AID YELLOW STND (SUTURE) IMPLANT
CANISTER SUCTION 2500CC (MISCELLANEOUS) ×3 IMPLANT
CANNULA GUNDRY RCSP 15FR (MISCELLANEOUS) ×3 IMPLANT
CATH CPB KIT GERHARDT (MISCELLANEOUS) ×3 IMPLANT
CATH HEART VENT LEFT (CATHETERS) ×2 IMPLANT
CATH THORACIC 28FR (CATHETERS) ×3 IMPLANT
CATH/SQUID NICHOLS JEHLE COR (CATHETERS) IMPLANT
CONT SPEC 4OZ CLIKSEAL STRL BL (MISCELLANEOUS) ×3 IMPLANT
CRADLE DONUT ADULT HEAD (MISCELLANEOUS) ×3 IMPLANT
DERMABOND ADVANCED (GAUZE/BANDAGES/DRESSINGS) ×1
DERMABOND ADVANCED .7 DNX12 (GAUZE/BANDAGES/DRESSINGS) ×2 IMPLANT
DRAIN CHANNEL 28F RND 3/8 FF (WOUND CARE) ×3 IMPLANT
DRAPE CARDIOVASCULAR INCISE (DRAPES) ×1
DRAPE SLUSH/WARMER DISC (DRAPES) ×3 IMPLANT
DRAPE SRG 135X102X78XABS (DRAPES) ×2 IMPLANT
DRSG AQUACEL AG ADV 3.5X14 (GAUZE/BANDAGES/DRESSINGS) ×3 IMPLANT
ELECT BLADE 4.0 EZ CLEAN MEGAD (MISCELLANEOUS) ×6
ELECT CAUTERY BLADE 6.4 (BLADE) ×3 IMPLANT
ELECT REM PT RETURN 9FT ADLT (ELECTROSURGICAL) ×6
ELECTRODE BLDE 4.0 EZ CLN MEGD (MISCELLANEOUS) ×4 IMPLANT
ELECTRODE REM PT RTRN 9FT ADLT (ELECTROSURGICAL) ×4 IMPLANT
FELT TEFLON 1X6 (MISCELLANEOUS) ×3 IMPLANT
GAUZE SPONGE 4X4 12PLY STRL (GAUZE/BANDAGES/DRESSINGS) ×6 IMPLANT
GLOVE BIO SURGEON STRL SZ 6 (GLOVE) ×12 IMPLANT
GLOVE BIO SURGEON STRL SZ 6.5 (GLOVE) ×33 IMPLANT
GOWN STRL REUS W/ TWL LRG LVL3 (GOWN DISPOSABLE) ×8 IMPLANT
GOWN STRL REUS W/TWL LRG LVL3 (GOWN DISPOSABLE) ×4
HEMOSTAT POWDER SURGIFOAM 1G (HEMOSTASIS) ×9 IMPLANT
HEMOSTAT SURGICEL 2X14 (HEMOSTASIS) ×3 IMPLANT
INSERT FOGARTY XLG (MISCELLANEOUS) IMPLANT
IV CATH 18G X1.75 CATHLON (IV SOLUTION) ×3 IMPLANT
KIT BASIN OR (CUSTOM PROCEDURE TRAY) ×3 IMPLANT
KIT CATH SUCT 8FR (CATHETERS) ×3 IMPLANT
KIT ROOM TURNOVER OR (KITS) ×3 IMPLANT
KIT SUCTION CATH 14FR (SUCTIONS) ×6 IMPLANT
KIT VASOVIEW 6 PRO VH 2400 (KITS) ×3 IMPLANT
LEAD PACING MYOCARDI (MISCELLANEOUS) ×3 IMPLANT
LINE VENT (MISCELLANEOUS) ×3 IMPLANT
MARKER GRAFT CORONARY BYPASS (MISCELLANEOUS) ×9 IMPLANT
NS IRRIG 1000ML POUR BTL (IV SOLUTION) ×18 IMPLANT
PACK OPEN HEART (CUSTOM PROCEDURE TRAY) ×3 IMPLANT
PAD ARMBOARD 7.5X6 YLW CONV (MISCELLANEOUS) ×6 IMPLANT
PAD ELECT DEFIB RADIOL ZOLL (MISCELLANEOUS) ×3 IMPLANT
PENCIL BUTTON HOLSTER BLD 10FT (ELECTRODE) ×6 IMPLANT
PUNCH AORTIC ROTATE  4.5MM 8IN (MISCELLANEOUS) ×3 IMPLANT
SET CARDIOPLEGIA MPS 5001102 (MISCELLANEOUS) ×3 IMPLANT
SPOGE SURGIFLO 8M (HEMOSTASIS) ×1
SPONGE GAUZE 4X4 12PLY STER LF (GAUZE/BANDAGES/DRESSINGS) ×6 IMPLANT
SPONGE LAP 18X18 X RAY DECT (DISPOSABLE) ×12 IMPLANT
SPONGE SURGIFLO 8M (HEMOSTASIS) ×2 IMPLANT
SUT BONE WAX W31G (SUTURE) ×3 IMPLANT
SUT ETHIBON 2 0 V 52N 30 (SUTURE) ×6 IMPLANT
SUT ETHIBOND NAB MH 2-0 36IN (SUTURE) ×3 IMPLANT
SUT MNCRL AB 4-0 PS2 18 (SUTURE) ×3 IMPLANT
SUT PROLENE 3 0 RB 1 (SUTURE) ×3 IMPLANT
SUT PROLENE 3 0 SH DA (SUTURE) ×3 IMPLANT
SUT PROLENE 3 0 SH1 36 (SUTURE) ×3 IMPLANT
SUT PROLENE 4 0 RB 1 (SUTURE) ×1
SUT PROLENE 4 0 TF (SUTURE) ×6 IMPLANT
SUT PROLENE 4-0 RB1 .5 CRCL 36 (SUTURE) ×2 IMPLANT
SUT PROLENE 5 0 C 1 36 (SUTURE) ×3 IMPLANT
SUT PROLENE 6 0 CC (SUTURE) ×9 IMPLANT
SUT PROLENE 7 0 BV1 MDA (SUTURE) ×3 IMPLANT
SUT PROLENE 8 0 BV175 6 (SUTURE) ×18 IMPLANT
SUT STEEL 6MS V (SUTURE) ×3 IMPLANT
SUT STEEL SZ 6 DBL 3X14 BALL (SUTURE) ×3 IMPLANT
SUT VIC AB 1 CTX 18 (SUTURE) ×6 IMPLANT
SUT VIC AB 2-0 CT1 27 (SUTURE) ×1
SUT VIC AB 2-0 CT1 TAPERPNT 27 (SUTURE) ×2 IMPLANT
SUTURE E-PAK OPEN HEART (SUTURE) ×3 IMPLANT
SYSTEM SAHARA CHEST DRAIN ATS (WOUND CARE) ×3 IMPLANT
TAPE CLOTH SURG 4X10 WHT LF (GAUZE/BANDAGES/DRESSINGS) ×3 IMPLANT
TAPE PAPER 2X10 WHT MICROPORE (GAUZE/BANDAGES/DRESSINGS) ×3 IMPLANT
TOWEL OR 17X24 6PK STRL BLUE (TOWEL DISPOSABLE) ×6 IMPLANT
TOWEL OR 17X26 10 PK STRL BLUE (TOWEL DISPOSABLE) ×6 IMPLANT
TRAY FOLEY IC TEMP SENS 16FR (CATHETERS) ×3 IMPLANT
TUBING INSUFFLATION (TUBING) ×3 IMPLANT
UNDERPAD 30X30 INCONTINENT (UNDERPADS AND DIAPERS) ×3 IMPLANT
VALVE MAGNA EASE AORTIC 23MM (Prosthesis & Implant Heart) ×3 IMPLANT
VENT LEFT HEART 12002 (CATHETERS) ×3
WATER STERILE IRR 1000ML POUR (IV SOLUTION) ×6 IMPLANT

## 2015-07-15 NOTE — Transfer of Care (Signed)
Immediate Anesthesia Transfer of Care Note  Patient: Wesley Martinez  Procedure(s) Performed: Procedure(s): CORONARY ARTERY BYPASS GRAFTING (CABG) x four, using left greater saphenous vein harvested endoscopically (N/A) AORTIC VALVE REPLACEMENT (AVR) (N/A) TRANSESOPHAGEAL ECHOCARDIOGRAM (TEE) (N/A)  Patient Location: SICU  Anesthesia Type:General  Level of Consciousness: sedated and Patient remains intubated per anesthesia plan  Airway & Oxygen Therapy: Patient remains intubated per anesthesia plan and Patient placed on Ventilator (see vital sign flow sheet for setting)  Post-op Assessment: Report given to RN and Post -op Vital signs reviewed and stable  Post vital signs: Reviewed and stable  Last Vitals:  Filed Vitals:   07/15/15 0600 07/15/15 0700  BP:    Pulse: 87 91  Temp:    Resp: 19 21    Complications: No apparent anesthesia complications

## 2015-07-15 NOTE — Progress Notes (Signed)
NIF -26, VC 1.0, pt extubated to 4lt [place]. SATs 98%. IS 500

## 2015-07-15 NOTE — Progress Notes (Signed)
*  PRELIMINARY RESULTS* Echocardiogram Echocardiogram Transesophageal has been performed.  Wesley Martinez 07/15/2015, 9:38 AM

## 2015-07-15 NOTE — Progress Notes (Signed)
      [address]       [place] [address]             [phone]       PM ROUNDS  S/p AVR CABG  Intubated, sedated  BP 98/54 mmHg  Pulse 90  Temp(Src) 98.2 F (36.8 C) (Oral)  Resp 14  Ht 5\' 11"  (1.803 m)  Wt 246 lb 0.5 oz (111.6 kg)  BMI 34.33 kg/m2  SpO2 100% On 1 mcg/kg/min dopamine. IABP @ 1:1 CI= 1.44 initially- has been given volume, PAD still relatively low at 14   Intake/Output Summary (Last 24 hours) at 07/15/15 1727 Last data filed at 07/15/15 1455  Gross per 24 hour  Intake   4913 ml  Output   1245 ml  Net   3668 ml   Hct= 34, K= 4.3  Doing well early postop  Viviann Spare C. Dorris Fetch, MD [facility] [phone]

## 2015-07-15 NOTE — Progress Notes (Addendum)
Dr. Dorris Fetch paged and notified regarding blood gas results, index, and vitals. Orders received to proceed with extubation. Also sought clarification regarding femoral sheath, states that "it can be pulled when the balloon is.". Will continue to closely monitor. Modena Jansky RN [address] [place]

## 2015-07-15 NOTE — Anesthesia Postprocedure Evaluation (Signed)
Anesthesia Post Note  Patient: Wesley Martinez  Procedure(s) Performed: Procedure(s) (LRB): CORONARY ARTERY BYPASS GRAFTING (CABG) x four, using left greater saphenous vein harvested endoscopically (N/A) AORTIC VALVE REPLACEMENT (AVR) (N/A) TRANSESOPHAGEAL ECHOCARDIOGRAM (TEE) (N/A)  Patient location during evaluation: SICU Anesthesia Type: General Level of consciousness: sedated Pain management: pain level controlled Vital Signs Assessment: post-procedure vital signs reviewed and stable Respiratory status: patient remains intubated per anesthesia plan Cardiovascular status: stable Anesthetic complications: no    Last Vitals:  Filed Vitals:   07/15/15 1608 07/15/15 1615  BP: 100/65 98/54  Pulse: 90 90  Temp: 36.6 C 36.8 C  Resp: 16 14    Last Pain:  Filed Vitals:   07/15/15 1623  PainSc: 0-No pain                 Ciel Chervenak,W. EDMOND

## 2015-07-15 NOTE — OR Nursing (Signed)
SICU charge nurse calls [phone]                                          [phone]                                          [phone]

## 2015-07-15 NOTE — Anesthesia Procedure Notes (Addendum)
Central Venous Catheter Insertion Performed by: anesthesiologist 07/15/2015 8:00 AM Patient location: OR. Preanesthetic checklist: patient identified, IV checked, site marked, risks and benefits discussed, surgical consent, monitors and equipment checked, pre-op evaluation, timeout performed and anesthesia consent Position: Trendelenburg Lidocaine 1% used for infiltration Landmarks identified and Seldinger technique used Catheter size: 9 Fr Central line was placed.MAC introducer Swan type and PA catheter depth:thermodilution and 50PA Cath depth:50 Procedure performed using ultrasound guided technique. Attempts: 1 Following insertion, line sutured and dressing applied. Post procedure assessment: blood return through all ports, free fluid flow and no air. Patient tolerated the procedure well with no immediate complications.   Procedure Name: Intubation Date/Time: 07/15/2015 8:15 AM Performed by: Lanell Matar Pre-anesthesia Checklist: Timeout performed, Patient identified, Emergency Drugs available, Suction available and Patient being monitored Patient Re-evaluated:Patient Re-evaluated prior to inductionOxygen Delivery Method: Circle system utilized Preoxygenation: Pre-oxygenation with 100% oxygen Intubation Type: IV induction Ventilation: Mask ventilation without difficulty, Oral airway inserted - appropriate to patient size and Two handed mask ventilation required Laryngoscope Size: Miller and 2 Grade View: Grade II Tube type: Oral Tube size: 8.0 mm Number of attempts: 1 Airway Equipment and Method: Stylet Placement Confirmation: ETT inserted through vocal cords under direct vision,  breath sounds checked- equal and bilateral,  positive ETCO2 and CO2 detector Secured at: 23 cm Tube secured with: Tape Dental Injury: Teeth and Oropharynx as per pre-operative assessment

## 2015-07-15 NOTE — Brief Op Note (Addendum)
      [address]       [place] [address]             [phone]      07/15/2015  1:29 PM  PATIENT:  Wesley Martinez  70 y.o. male  PRE-OPERATIVE DIAGNOSIS:  1. S/p STEMI 2. Multivessel CAD 3. Severe AS  POST-OPERATIVE DIAGNOSIS:   1. S/p STEMI 2. Multivessel CAD 3. Severe AS  PROCEDURE:  TRANSESOPHAGEAL ECHOCARDIOGRAM (TEE), MEDIAN STERNOTOMY for CORONARY ARTERY BYPASS GRAFTING (CABG) x 4 (LIMA to LAD, SVG SEQUENTIALLY to OM1 and OM2, and SVG to PDA) , using left thigh greater saphenous vein harvested endoscopically and left internal mammary artery, AORTIC VALVE REPLACEMENT (AVR) (using a Pericardial Tissue Valve, size 23 mm)   SURGEON:  Surgeon(s) and Role:    * Delight Ovens, MD - Primary  PHYSICIAN ASSISTANT: Doree Fudge PA-C  ANESTHESIA:   general  EBL:  Total I/O In: 2000 [I.V.:2000] Out: 125 [Urine:125]  DRAINS: Chest tubes placed in the mediastinal and pleural spaces   SPECIMEN:  Source of Specimen:  Native aortic valve leaflets  DISPOSITION OF SPECIMEN:  PATHOLOGY  COUNTS CORRECT:  YES  DICTATION: .Dragon Dictation  PLAN OF CARE: Admit to inpatient   PATIENT DISPOSITION:  ICU - intubated and hemodynamically stable.   Delay start of Pharmacological VTE agent (>24hrs) due to surgical blood loss or risk of bleeding: yes  BASELINE WEIGHT: 112 kg  Aortic Valve Etiology   Aortic Insufficiency:  Trivial/Trace  Aortic Valve Disease:  Yes.  Aortic Stenosis:  Yes. Smallest Aortic Valve Area: 0.9cm2; Highest Mean Gradient: .  Etiology (Choose at least one and up to  5 etiologies):  Degenerative - Calcified  Aortic Valve  Procedure Performed:  Replacement: Yes.  Bioprosthetic Valve. Implant Model Number:[id number], Size:23, Unique Device Identifier:[id number]  Repair/Reconstruction: No.   Aortic Annular Enlargement: No.

## 2015-07-15 NOTE — Progress Notes (Addendum)
Initial ABG results incorrect, wrong temperature entered per helping RN, temp corrected pH falsely elevated, blood gas wnl otherwise, MD aware, ABG obtained @ 2000 per night RN, will send edit sheetto POCT. Louie Bun S 8:10 PM

## 2015-07-15 NOTE — Procedures (Signed)
Extubation Procedure Note  Patient Details:   Name: Wesley Martinez DOB: 07-Jan-1946 MRN: [id number]   Airway Documentation:  Airway 8 mm (Active)  Secured at (cm) 23 cm 07/15/2015  4:08 PM  Measured From Lips 07/15/2015  4:08 PM  Secured Location Right 07/15/2015  4:08 PM  Secured By Pink Tape 07/15/2015  4:08 PM  Cuff Pressure (cm H2O) 26 cm H2O 07/15/2015  4:08 PM  Site Condition Dry 07/15/2015  4:08 PM    Evaluation  O2 sats: stable throughout Complications: No apparent complications Patient did tolerate procedure well. Bilateral Breath Sounds: Clear Suctioning: Oral, Airway Yes  [name] [facility] 07/15/2015, 10:32 PM

## 2015-07-16 ENCOUNTER — Encounter (HOSPITAL_COMMUNITY): Payer: Self-pay | Admitting: Cardiothoracic Surgery

## 2015-07-16 ENCOUNTER — Inpatient Hospital Stay (HOSPITAL_COMMUNITY): Payer: Medicare Other

## 2015-07-16 LAB — GLUCOSE, CAPILLARY
GLUCOSE-CAPILLARY: 100 mg/dL — AB (ref 65–99)
GLUCOSE-CAPILLARY: 100 mg/dL — AB (ref 65–99)
GLUCOSE-CAPILLARY: 102 mg/dL — AB (ref 65–99)
GLUCOSE-CAPILLARY: 108 mg/dL — AB (ref 65–99)
GLUCOSE-CAPILLARY: 120 mg/dL — AB (ref 65–99)
GLUCOSE-CAPILLARY: 89 mg/dL (ref 65–99)
Glucose-Capillary: 106 mg/dL — ABNORMAL HIGH (ref 65–99)
Glucose-Capillary: 100 mg/dL — ABNORMAL HIGH (ref 65–99)
Glucose-Capillary: 100 mg/dL — ABNORMAL HIGH (ref 65–99)
Glucose-Capillary: 90 mg/dL (ref 65–99)
Glucose-Capillary: 94 mg/dL (ref 65–99)
Glucose-Capillary: 105 mg/dL — ABNORMAL HIGH (ref 65–99)
Glucose-Capillary: 131 mg/dL — ABNORMAL HIGH (ref 65–99)
Glucose-Capillary: 142 mg/dL — ABNORMAL HIGH (ref 65–99)

## 2015-07-16 LAB — CREATININE, SERUM
Creatinine, Ser: 1.09 mg/dL (ref 0.61–1.24)
GFR calc Af Amer: >60 mL/min (ref >60)
GFR calc non Af Amer: >60 mL/min (ref >60)

## 2015-07-16 LAB — BASIC METABOLIC PANEL
Anion gap: 9 (ref 5–15)
BUN: 20 mg/dL (ref 6–20)
CALCIUM: 7.2 mg/dL — AB (ref 8.9–10.3)
CHLORIDE: 111 mmol/L (ref 101–111)
CO2: 19 mmol/L — ABNORMAL LOW (ref 22–32)
CREATININE: 1.17 mg/dL (ref 0.61–1.24)
Glucose, Bld: 102 mg/dL — ABNORMAL HIGH (ref 65–99)
Potassium: 4.2 mmol/L (ref 3.5–5.1)
SODIUM: 139 mmol/L (ref 135–145)

## 2015-07-16 LAB — POCT I-STAT, CHEM 8
BUN: 21 mg/dL — AB (ref 6–20)
CREATININE: 1.1 mg/dL (ref 0.61–1.24)
Calcium, Ion: 1.1 mmol/L — ABNORMAL LOW (ref 1.13–1.30)
Chloride: 106 mmol/L (ref 101–111)
GLUCOSE: 137 mg/dL — AB (ref 65–99)
HEMATOCRIT: 30 % — AB (ref 39.0–52.0)
Hemoglobin: 10.2 g/dL — ABNORMAL LOW (ref 13.0–17.0)
Potassium: 4.1 mmol/L (ref 3.5–5.1)
Sodium: 142 mmol/L (ref 135–145)
TCO2: 21 mmol/L (ref 0–100)

## 2015-07-16 LAB — CBC
HCT: 31.1 % — ABNORMAL LOW (ref 39.0–52.0)
HCT: 30.4 % — ABNORMAL LOW (ref 39.0–52.0)
HEMOGLOBIN: 10.2 g/dL — AB (ref 13.0–17.0)
Hemoglobin: 10.2 g/dL — ABNORMAL LOW (ref 13.0–17.0)
MCH: 25.4 pg — AB (ref 26.0–34.0)
MCH: 26.4 pg (ref 26.0–34.0)
MCHC: 32.8 g/dL (ref 30.0–36.0)
MCHC: 33.6 g/dL (ref 30.0–36.0)
MCV: 77.4 fL — ABNORMAL LOW (ref 78.0–100.0)
MCV: 78.8 fL (ref 78.0–100.0)
PLATELETS: 94 10*3/uL — AB (ref 150–400)
PLATELETS: 95 10*3/uL — AB (ref 150–400)
RBC: 4.02 MIL/uL — ABNORMAL LOW (ref 4.22–5.81)
RBC: 3.86 MIL/uL — AB (ref 4.22–5.81)
RDW: 14.9 % (ref 11.5–15.5)
RDW: 15.7 % — ABNORMAL HIGH (ref 11.5–15.5)
WBC: 11.7 10*3/uL — ABNORMAL HIGH (ref 4.0–10.5)
WBC: 12 10*3/uL — ABNORMAL HIGH (ref 4.0–10.5)

## 2015-07-16 LAB — CARBOXYHEMOGLOBIN
Carboxyhemoglobin: 1.1 % (ref 0.5–1.5)
Methemoglobin: 1 % (ref 0.0–1.5)
O2 Saturation: 70.2 %
Total hemoglobin: 10.4 g/dL — ABNORMAL LOW (ref 13.5–18.0)

## 2015-07-16 LAB — HEMOGLOBIN A1C
Hgb A1c MFr Bld: 6.3 % — ABNORMAL HIGH (ref 4.8–5.6)
Mean Plasma Glucose: 134 mg/dL

## 2015-07-16 LAB — MAGNESIUM
Magnesium: 2.7 mg/dL — ABNORMAL HIGH (ref 1.7–2.4)
Magnesium: 2.7 mg/dL — ABNORMAL HIGH (ref 1.7–2.4)

## 2015-07-16 LAB — POCT ACTIVATED CLOTTING TIME: Activated Clotting Time: 121 seconds

## 2015-07-16 MED ORDER — CHLORHEXIDINE GLUCONATE 0.12 % MT SOLN
OROMUCOSAL | Status: AC
  Filled 2015-07-16: qty 15

## 2015-07-16 MED ORDER — INSULIN ASPART 100 UNIT/ML ~~LOC~~ SOLN
0.0000 [IU] | SUBCUTANEOUS | Status: DC
  Administered 2015-07-16 – 2015-07-17 (×7): 2 [IU] via SUBCUTANEOUS
  Administered 2015-07-17: 4 [IU] via SUBCUTANEOUS
  Administered 2015-07-18: 2 [IU] via SUBCUTANEOUS

## 2015-07-16 MED FILL — Mannitol IV Soln 20%: INTRAVENOUS | Qty: 500 | Status: AC

## 2015-07-16 MED FILL — Lidocaine HCl IV Inj 20 MG/ML: INTRAVENOUS | Qty: 5 | Status: AC

## 2015-07-16 MED FILL — Heparin Sodium (Porcine) Inj 1000 Unit/ML: INTRAMUSCULAR | Qty: 10 | Status: AC

## 2015-07-16 MED FILL — Sodium Bicarbonate IV Soln 8.4%: INTRAVENOUS | Qty: 50 | Status: AC

## 2015-07-16 MED FILL — Sodium Chloride IV Soln 0.9%: INTRAVENOUS | Qty: 2000 | Status: AC

## 2015-07-16 MED FILL — Electrolyte-R (PH 7.4) Solution: INTRAVENOUS | Qty: 3000 | Status: AC

## 2015-07-16 NOTE — Progress Notes (Signed)
Utilization Review Completed.  

## 2015-07-16 NOTE — Progress Notes (Signed)
Patient ID: Wesley Martinez, male   DOB: [birth year], 69 y.o.   MRN: [id number] TCTS DAILY ICU PROGRESS NOTE                   [address]            [place] [address]          [phone]   1 Day Post-Op Procedure(s) (LRB): CORONARY ARTERY BYPASS GRAFTING (CABG) x four, using left greater saphenous vein harvested endoscopically (N/A) AORTIC VALVE REPLACEMENT (AVR) (N/A) TRANSESOPHAGEAL ECHOCARDIOGRAM (TEE) (N/A)  Total Length of Stay:  LOS: 4 days   Subjective: awake and alert   Objective: Vital signs in last 24 hours: Temp:  [97.5 F (36.4 C)-100.2 F (37.9 C)] 99.3 F (37.4 C) (03/14 0730) Pulse Rate:  [71-99] 99 (03/14 0730) Cardiac Rhythm:  [-] Atrial paced;Normal sinus rhythm (03/14 0400) Resp:  [11-33] 18 (03/14 0730) BP: (98-128)/(54-88) 122/85 mmHg (03/13 1745) SpO2:  [93 %-100 %] 95 % (03/14 0730) Arterial Line BP: (73-139)/(37-59) 134/46 mmHg (03/14 0730) FiO2 (%):  [40 %-60 %] 40 % (03/13 2130) Weight:  [266 lb 12.1 oz (121 kg)] 266 lb 12.1 oz (121 kg) (03/14 0455)  Filed Weights   07/14/15 0500 07/15/15 0200 07/16/15 0455  Weight: 246 lb 11.1 oz (111.9 kg) 246 lb 0.5 oz (111.6 kg) 266 lb 12.1 oz (121 kg)    Weight change: 20 lb 11.6 oz (9.4 kg)   Hemodynamic parameters for last 24 hours: PAP: (27-47)/(13-25) 37/18 mmHg CO:  [2.9 L/min-6.3 L/min] 6.3 L/min CI:  [1.3 L/min/m2-2.7 L/min/m2] 2.7 L/min/m2  Intake/Output from previous day: 03/13 0701 - 03/14 0700 In: 6408 [I.V.:4475; Blood:773; NG/GT:60; IV Piggyback:1100] Out: 2005 [Urine:1685; Emesis/NG output:20; Chest Tube:300]  Intake/Output this shift:    Current Meds: Scheduled Meds: . acetaminophen  1,000 mg Oral 4 times per day   Or  . acetaminophen (TYLENOL) oral liquid 160 mg/5 mL  1,000 mg Per Tube 4 times per day  . antiseptic oral rinse  7 mL Mouth Rinse QID  . aspirin EC  325 mg Oral Daily   Or  . aspirin  324 mg Per Tube Daily  . atorvastatin  40 mg Oral QAC breakfast    . cefUROXime (ZINACEF)  IV  1.5 g Intravenous Q12H  . chlorhexidine      . chlorhexidine gluconate  15 mL Mouth Rinse BID  . docusate sodium  200 mg Oral Daily  . famotidine (PEPCID) IV  20 mg Intravenous Q12H  . insulin aspart  0-24 Units Subcutaneous 6 times per day  . metoprolol tartrate  12.5 mg Oral BID   Or  . metoprolol tartrate  12.5 mg Per Tube BID  . [START ON 07/17/2015] pantoprazole  40 mg Oral Daily  . sodium chloride flush  3 mL Intravenous Q12H  . [START ON 07/18/2015] sucralfate  1 g Oral QAC breakfast   Continuous Infusions: . sodium chloride 10 mL/hr at 07/16/15 0700  . sodium chloride    . sodium chloride 10 mL/hr at 07/16/15 0700  . dexmedetomidine Stopped (07/15/15 1900)  . DOPamine 2 mcg/kg/min (07/16/15 0700)  . lactated ringers 20 mL/hr at 07/16/15 0700  . lactated ringers    . nitroGLYCERIN Stopped (07/15/15 1611)  . phenylephrine (NEO-SYNEPHRINE) Adult infusion Stopped (07/16/15 0514)   PRN Meds:.sodium chloride, lactated ringers, metoprolol, midazolam, morphine injection, ondansetron (ZOFRAN) IV, oxyCODONE, sodium chloride flush, traMADol  General appearance: alert and cooperative Neurologic: intact Heart: regular rate and  rhythm, S1, S2 normal, no murmur, click, rub or gallop Lungs: diminished breath sounds bibasilar Abdomen: soft, non-tender; bowel sounds normal; no masses,  no organomegaly Extremities: extremities normal, atraumatic, no cyanosis or edema and Homans sign is negative, no sign of DVT Wound: dressing intact  Lab Results: CBC: Recent Labs  07/15/15 2150 07/15/15 2201 07/16/15 0255  WBC 11.3*  --  11.7*  HGB 10.2* 9.9* 10.2*  HCT 30.9* 29.0* 31.1*  PLT 91*  --  94*   BMET:  Recent Labs  07/15/15 0420  07/15/15 2201 07/16/15 0255  NA 140  < > 141 139  K 3.7  < > 4.5 4.2  CL 108  < > 108 111  CO2 22  --   --  19*  GLUCOSE 104*  < > 103* 102*  BUN 15  < > 19 20  CREATININE 1.37*  < > 1.20 1.17  CALCIUM 8.6*  --   --   7.2*  < > = values in this interval not displayed.  PT/INR:  Recent Labs  07/15/15 1716  LABPROT 20.9*  INR 1.81*   Radiology: Dg Chest Port 1 View  07/15/2015  CLINICAL DATA:  Myocardial infarction. EXAM: PORTABLE CHEST 1 VIEW COMPARISON:  Chest x-ray dated 07/12/2015. FINDINGS: Borderline cardiomegaly is stable. Endotracheal tube well positioned with tip approximately 4.5 cm above the carina. Enteric tube passes below the diaphragm. Swan-Ganz catheter in place with tip in the midline. Left-sided chest tube in place with tip directed towards the lung apex. There is central pulmonary vascular congestion without frank alveolar pulmonary edema. Suspect small left pleural effusion and left basilar atelectasis. IMPRESSION: 1. Tubes and lines appear adequately positioned, as described above. 2. Central pulmonary vascular congestion without frank alveolar pulmonary edema, indicating mild volume overload. 3. Probable small left pleural effusion with adjacent atelectasis. Electronically Signed   By: Bary Richard M.D.   On: 07/15/2015 16:38     Assessment/Plan: S/P Procedure(s) (LRB): CORONARY ARTERY BYPASS GRAFTING (CABG) x four, using left greater saphenous vein harvested endoscopically (N/A) AORTIC VALVE REPLACEMENT (AVR) (N/A) TRANSESOPHAGEAL ECHOCARDIOGRAM (TEE) (N/A) Mobilize Diuresis d/c tubes/lines See progression orders Expected Acute  Blood - loss Anemia Renal function stable wean IAB and likely d/c today  Delight Ovens 07/16/2015 7:49 AM

## 2015-07-16 NOTE — Progress Notes (Signed)
Cath lab made aware of order to dc IABP, will be available to come in 30 minutes. Louie Bun S 11:32 AM

## 2015-07-16 NOTE — Care Management Important Message (Signed)
Important Message  Patient Details  Name: Wesley Martinez MRN: [id number] Date of Birth: [birth year]   Medicare Important Message Given:  Other (see comment)    Ules Marsala Abena 07/16/2015, 11:34 AM

## 2015-07-16 NOTE — Progress Notes (Signed)
CT surgery p.m. Rounds  Patient resting comfortably Doing well postop day 1 after CABG, AVR Sinus, vital signs stable P.m. labs checked and are satisfactory

## 2015-07-16 NOTE — Plan of Care (Signed)
Problem: Activity: Goal: Risk for activity intolerance will decrease Outcome: Not Progressing Pt still on bed rest due to IABP  Problem: Bowel/Gastric: Goal: Gastrointestinal status for postoperative course will improve Outcome: Progressing Pt tolerating ice chips at this time. Will move to full liquid in AM  Problem: Cardiac: Goal: Hemodynamic stability will improve Outcome: Progressing Currently on 72mg neo, 251m/kg Dopamine, and A paced at 90. Goal: Ability to maintain an adequate cardiac output will improve Outcome: Progressing CI 1.7-2.4  Problem: Respiratory: Goal: Ability to tolerate decreased levels of ventilator support will improve Outcome: Completed/Met Date Met:  07/16/15 Extubated 07/15/2015

## 2015-07-16 NOTE — Op Note (Signed)
NAME:  Wesley Martinez, Wesley Martinez NO.:  [id number]  MEDICAL RECORD NO.:  [id number]  LOCATION:  2S04C                        FACILITY:  [facility]  PHYSICIAN:  Sheliah Plane, MD    DATE OF BIRTH:  April 11, 1946  DATE OF PROCEDURE:  07/15/2015 DATE OF DISCHARGE:                              OPERATIVE REPORT   PREOPERATIVE DIAGNOSIS:  Severe aortic stenosis and coronary occlusive disease with recent anterior apical myocardial infarction.  POSTOPERATIVE DIAGNOSES:  Severe aortic stenosis and coronary occlusive disease with recent anterior apical myocardial infarction.  SURGICAL PROCEDURE:  Aortic valve replacement with a 23 pericardial tissue valve [employer], model 3300 TFX, serial [id number] and coronary artery bypass grafting x4 with left internal mammary to the left anterior descending coronary artery, reverse saphenous vein graft to the posterior descending coronary artery, sequential reverse saphenous vein graft to the first and second obtuse marginal coronary arteries with left greater saphenous vein thigh and calf endovein harvesting.  SURGEON:  Sheliah Plane, MD  FIRST ASSISTANT:  Doree Fudge, PA  BRIEF HISTORY:  The patient is a 70 year old male with at least several months' history of increasing anginal symptoms and indigestion.  He was prescribed medication for his indigestion, but without any improvement in symptoms.  On March 10th, he went to a local grocery store to buy supplies in preparation for a horse show and became diaphoretic and very nauseated, left the store, and then went to an urgent care across the street at 11 a.m.  He was then transferred to [facility] where he underwent cardiac catheterization, placement of a balloon pump.  His stay was then delayed until 8 p.m. on Friday night when he was transferred to [facility].  At that time, he had no evaluation of his LV function other than measuring the end-diastolic  pressure. Echocardiogram was ordered.  The patient was seen immediately after transfer to [facility] by Surgical Service and felt not to warrant emergency bypass surgery, but stabilization, full evaluation, and then proceed with surgery on an urgent basis.  The patient was pain free.  Risks and options were discussed with him and his wife for valve replacement. Because of his age, a tissue valve was thought most appropriate.  The patient had evidence of renal insufficiency, at the time of his admission, stage 3A.  After this, signed informed consent.  The patient was agreeable with proceeding with surgery.  DESCRIPTION OF PROCEDURE:  The patient was brought directly to the operating room with intraaortic balloon pump still in place and functioning Swan-Ganz arterial line monitors were placed.  The patient underwent general endotracheal anesthesia and remained hemodynamically stable.  TEE probe was placed and findings are dictated under separate note, but this confirmed significant decrease in LV function.  Ejection fraction approximately 30% with anterior apical hypokinesis and inferior hypokinesis.  Skin of the chest and legs were prepped with Betadine and draped in usual sterile manner.  Using the [employer] endovein harvesting system, a segment of greater saphenous vein was harvested from the left thigh and calf and was of excellent quality.  Median sternotomy was performed. Left internal mammary artery was dissected down as a pedicle graft and was of  excellent quality.  Pericardium was opened.  On examination, the heart as suspected from the catheterization films.  The right coronary artery was chronically occluded, the distal vessels did not appear on the cath film.  The posterior descending was very diffusely diseased. There was evidence of transmural anterior apical infarct on examination of the heart.  There was evidence of old scarring on the inferior surface of the heart.  The patient  had significant left ventricular hypertrophy.  He was systemically heparinized.  Ascending aorta was cannulated.  Retrograde cardioplegia catheter was placed.  Dual stage venous cannula was placed.  The patient was placed on cardiopulmonary bypass 2.4 L/min/m2.  Sites of anastomosis were selected and dissected out of the epicardium.  Right superior pulmonary vein vent was placed. Aortic cross-clamp was applied and 500 mL of cold blood potassium cardioplegia was administered with diastolic arrest of the heart. Retrograde cardioplegia was also administered intermittently.  Attention was turned first to the distal vessels and the mid posterior descending vessel was opened, it was a very poor quality vessel, severely diseased distally.  A 1 mm probe passed distally.  Using a running 7-0 Prolene, distal anastomosis was performed.  The heart was then elevated.  The first obtuse marginal and second obtuse marginal were identified.  The distal circ vessel was very small as it arose from the AV groove.  The first obtuse marginal was opened, this was a relatively very small vessel, admitted a 1-mm probe.  Using a diamond-type, side-to-side anastomosis with a running 8-0 Prolene, side anastomosis was completed. Distal extent of the same vein was then carried to the larger second obtuse marginal, which admitted a 1.5 mm probe.  Using a running 8-0 Prolene, distal anastomosis was performed.  Attention was then turned to the left anterior descending coronary artery, which was as previously noted, had evidence of acute anterior apical infarct.  The LAD was diffusely diseased between the mid and distal third of the vessel.  The LAD was opened and admitted a 1.5 mm probe proximally and distally. Using a running 8-0 Prolene, left internal mammary artery was anastomosed to left anterior descending coronary artery.  With the vessels bypass completed, we then turned our attention to the aortic valve.   Transverse aortotomy was performed.  This gave a good exposure of the trileaflet, highly calcified aortic valve, which was excised and annulus debrided.  Care was taken to remove all loose calcific debris. The annulus was sized for a 23 pericardial tissue valve.  #2 Tycron pledgeted sutures were placed circumferentially on the ventricular surface of the heart and used to secure the aortic valve in place.  The valves seated well.  The left main coronary artery ostium was large, but was not impinged by the valve.  There was calcification around the ostium of the right coronary artery, but ostium was patent.  With the valve seated well, the aortotomy was then closed with horizontal mattress 4-0 Prolene suture over felt strips, second layer running Prolene suture was placed.  We then performed 2 punch aortotomies in the ascending aorta and each of the two vein grafts were anastomosed to the ascending aorta.  The heart was allowed to passively fill and de-air. Bulldog on the mammary artery was removed with rise in myocardial septal temperature.  Warm retrograde cardioplegia was administered and the ascending aorta was de-aired.  The anastomoses were completed and aortic cross-clamp was removed with total crossclamp time of 167 minutes.  The patient transiently had heart block.  Atrial and ventricular pacing wires were applied; however, this completely resolved and ultimately the patient returned to a sinus rhythm.  Right superior pulmonary vein vent was removed.  Retrograde cardioplegia catheter was removed.  TEE showed good function of the prosthetic aortic valve without evidence of perivalvular leak.  With the patient's body temperature rewarmed to 37 degrees on low-dose dopamine and with the intraaortic balloon pump functioning, the patient was then ventilated and weaned from cardiopulmonary bypass, he remained hemodynamically stable.  He was decannulated in usual fashion.  Protamine sulfate  was administered with operative field hemostatic.  Atrial and ventricular pacing wires were secured.  Left pleural tube and a Blake mediastinal drain were left in place.  The pericardium was loosely reapproximated.  Sternum was closed with #6 stainless steel wire.  Fascia was closed with interrupted 0- Vicryl, running 3-0 Vicryl in the subcutaneous tissue, 4-0 subcuticular stitch in skin edges.  Dry dressings were applied.  Sponge and needle count was reported as correct at the completion of the procedure.  The patient tolerated the procedure without obvious complications and was transferred to the Surgical Intensive Care Unit for further postoperative care.  He did not require any blood bank blood products during the operative procedure.  Total pump time was 212 minutes.     Sheliah Plane, MD     EG/MEDQ  D:  07/16/2015  T:  07/16/2015  Job:  [id number]

## 2015-07-17 ENCOUNTER — Inpatient Hospital Stay (HOSPITAL_COMMUNITY): Payer: Medicare Other

## 2015-07-17 DIAGNOSIS — Z954 Presence of other heart-valve replacement: Secondary | ICD-10-CM

## 2015-07-17 DIAGNOSIS — Z951 Presence of aortocoronary bypass graft: Secondary | ICD-10-CM

## 2015-07-17 DIAGNOSIS — I2510 Atherosclerotic heart disease of native coronary artery without angina pectoris: Secondary | ICD-10-CM

## 2015-07-17 LAB — GLUCOSE, CAPILLARY
GLUCOSE-CAPILLARY: 124 mg/dL — AB (ref 65–99)
GLUCOSE-CAPILLARY: 126 mg/dL — AB (ref 65–99)
GLUCOSE-CAPILLARY: 146 mg/dL — AB (ref 65–99)
GLUCOSE-CAPILLARY: 151 mg/dL — AB (ref 65–99)
GLUCOSE-CAPILLARY: 162 mg/dL — AB (ref 65–99)
Glucose-Capillary: 96 mg/dL (ref 65–99)
Glucose-Capillary: 102 mg/dL — ABNORMAL HIGH (ref 65–99)
Glucose-Capillary: 103 mg/dL — ABNORMAL HIGH (ref 65–99)
Glucose-Capillary: 101 mg/dL — ABNORMAL HIGH (ref 65–99)
Glucose-Capillary: 138 mg/dL — ABNORMAL HIGH (ref 65–99)
Glucose-Capillary: 107 mg/dL — ABNORMAL HIGH (ref 65–99)

## 2015-07-17 LAB — CBC
HCT: 30.1 % — ABNORMAL LOW (ref 39.0–52.0)
Hemoglobin: 9.5 g/dL — ABNORMAL LOW (ref 13.0–17.0)
MCH: 24.7 pg — ABNORMAL LOW (ref 26.0–34.0)
MCHC: 31.6 g/dL (ref 30.0–36.0)
MCV: 78.4 fL (ref 78.0–100.0)
Platelets: 92 10*3/uL — ABNORMAL LOW (ref 150–400)
RBC: 3.84 MIL/uL — ABNORMAL LOW (ref 4.22–5.81)
RDW: 15.6 % — ABNORMAL HIGH (ref 11.5–15.5)
WBC: 10.8 10*3/uL — ABNORMAL HIGH (ref 4.0–10.5)

## 2015-07-17 LAB — BASIC METABOLIC PANEL
Anion gap: 8 (ref 5–15)
BUN: 22 mg/dL — ABNORMAL HIGH (ref 6–20)
CO2: 23 mmol/L (ref 22–32)
Calcium: 7.8 mg/dL — ABNORMAL LOW (ref 8.9–10.3)
Chloride: 110 mmol/L (ref 101–111)
Creatinine, Ser: 1.04 mg/dL (ref 0.61–1.24)
GFR calc Af Amer: >60 mL/min (ref >60)
GFR calc non Af Amer: >60 mL/min (ref >60)
Glucose, Bld: 142 mg/dL — ABNORMAL HIGH (ref 65–99)
Potassium: 4.1 mmol/L (ref 3.5–5.1)
Sodium: 141 mmol/L (ref 135–145)

## 2015-07-17 LAB — HEPATIC FUNCTION PANEL
ALT: 29 U/L (ref 17–63)
AST: 38 U/L (ref 15–41)
Albumin: 3 g/dL — ABNORMAL LOW (ref 3.5–5.0)
Alkaline Phosphatase: 48 U/L (ref 38–126)
Bilirubin, Direct: 0.2 mg/dL (ref 0.1–0.5)
Indirect Bilirubin: 0.4 mg/dL (ref 0.3–0.9)
Total Bilirubin: 0.6 mg/dL (ref 0.3–1.2)
Total Protein: 5.8 g/dL — ABNORMAL LOW (ref 6.5–8.1)

## 2015-07-17 LAB — CARBOXYHEMOGLOBIN
Carboxyhemoglobin: 1.2 % (ref 0.5–1.5)
Methemoglobin: 0.7 % (ref 0.0–1.5)
O2 Saturation: 63 %
Total hemoglobin: 9.9 g/dL — ABNORMAL LOW (ref 13.5–18.0)

## 2015-07-17 LAB — TSH: TSH: 1.367 u[IU]/mL (ref 0.350–4.500)

## 2015-07-17 LAB — MAGNESIUM: Magnesium: 2.9 mg/dL — ABNORMAL HIGH (ref 1.7–2.4)

## 2015-07-17 MED ORDER — AMIODARONE HCL IN DEXTROSE 360-4.14 MG/200ML-% IV SOLN
60.0000 mg/h | INTRAVENOUS | Status: AC
  Administered 2015-07-17: 60 mg/h via INTRAVENOUS
  Filled 2015-07-17: qty 200

## 2015-07-17 MED ORDER — AMIODARONE HCL 200 MG PO TABS
400.0000 mg | ORAL_TABLET | Freq: Two times a day (BID) | ORAL | Status: DC
  Administered 2015-07-17 – 2015-07-20 (×7): 400 mg via ORAL
  Filled 2015-07-17 (×8): qty 2

## 2015-07-17 MED ORDER — AMIODARONE HCL IN DEXTROSE 360-4.14 MG/200ML-% IV SOLN
60.0000 mg/h | INTRAVENOUS | Status: DC
  Administered 2015-07-17: 60 mg/h via INTRAVENOUS

## 2015-07-17 MED ORDER — AMIODARONE LOAD VIA INFUSION
150.0000 mg | Freq: Once | INTRAVENOUS | Status: AC
  Administered 2015-07-17: 150 mg via INTRAVENOUS
  Filled 2015-07-17: qty 83.34

## 2015-07-17 MED ORDER — AMIODARONE HCL IN DEXTROSE 360-4.14 MG/200ML-% IV SOLN
INTRAVENOUS | Status: AC
  Filled 2015-07-17: qty 200

## 2015-07-17 MED ORDER — FUROSEMIDE 10 MG/ML IJ SOLN
40.0000 mg | Freq: Once | INTRAMUSCULAR | Status: AC
  Administered 2015-07-17: 40 mg via INTRAVENOUS
  Filled 2015-07-17: qty 4

## 2015-07-17 MED ORDER — AMIODARONE LOAD VIA INFUSION
150.0000 mg | Freq: Once | INTRAVENOUS | Status: DC
  Filled 2015-07-17: qty 83.34

## 2015-07-17 MED ORDER — AMIODARONE HCL 200 MG PO TABS: 400.0000 mg | ORAL_TABLET | Freq: Every day | ORAL | Status: DC

## 2015-07-17 MED ORDER — AMIODARONE HCL IN DEXTROSE 360-4.14 MG/200ML-% IV SOLN: 30.0000 mg/h | INTRAVENOUS | Status: DC

## 2015-07-17 MED ORDER — AMIODARONE HCL IN DEXTROSE 360-4.14 MG/200ML-% IV SOLN
30.0000 mg/h | INTRAVENOUS | Status: DC
  Filled 2015-07-17: qty 200

## 2015-07-17 NOTE — Progress Notes (Signed)
Patient ID: Wesley Martinez, male   DOB: [birth year], 70 y.o.   MRN: [id number] EVENING ROUNDS NOTE :     [address]       [place] [address]             [phone]                 2 Days Post-Op Procedure(s) (LRB): CORONARY ARTERY BYPASS GRAFTING (CABG) x four, using left greater saphenous vein harvested endoscopically (N/A) AORTIC VALVE REPLACEMENT (AVR) (N/A) TRANSESOPHAGEAL ECHOCARDIOGRAM (TEE) (N/A)  Total Length of Stay:  LOS: 5 days  BP 105/72 mmHg  Pulse 83  Temp(Src) 98.4 F (36.9 C) (Oral)  Resp 18  Ht 5\' 11"  (1.803 m)  Wt 262 lb 2 oz (118.9 kg)  BMI 36.58 kg/m2  SpO2 93%  .Intake/Output      03/14 0701 - 03/15 0700 03/15 0701 - 03/16 0700   P.O. 120 360   I.V. (mL/kg) 493.8 (4.2) 5559.3 (46.8)   Blood     NG/GT     IV Piggyback 100 50   Total Intake(mL/kg) 713.8 (6) 5969.3 (50.2)   Urine (mL/kg/hr) 1285 (0.5) 800 (0.6)   Emesis/NG output     Chest Tube 370 (0.1) 120 (0.1)   Total Output 1655 920   Net -941.2 +5049.3          . sodium chloride Stopped (07/16/15 1500)  . sodium chloride    . sodium chloride Stopped (07/16/15 1300)  . amiodarone 30 mg/hr (07/17/15 1800)  . dexmedetomidine Stopped (07/15/15 1900)  . lactated ringers 10 mL/hr at 07/17/15 1800  . lactated ringers    . nitroGLYCERIN Stopped (07/15/15 1611)  . phenylephrine (NEO-SYNEPHRINE) Adult infusion Stopped (07/16/15 0514)     Lab Results  Component Value Date   WBC 10.8* 07/17/2015   HGB 9.5* 07/17/2015   HCT 30.1* 07/17/2015   PLT 92* 07/17/2015   GLUCOSE 142* 07/17/2015   CHOL 108 07/13/2015   TRIG 82 07/13/2015   HDL 28* 07/13/2015   LDLCALC 64 07/13/2015   ALT 29 07/17/2015   AST 38 07/17/2015   NA 141 07/17/2015   K 4.1 07/17/2015   CL 110 07/17/2015   CREATININE 1.04 07/17/2015   BUN 22* 07/17/2015   CO2 23 07/17/2015   TSH 1.367 07/17/2015   INR 1.81* 07/15/2015   HGBA1C 6.3* 07/15/2015   Stable day, no more afib, walking better   Delight Ovens MD  Beeper [phone] Office [phone] 07/17/2015 6:56 PM

## 2015-07-17 NOTE — Progress Notes (Signed)
Amiodarone Drug - Drug Interaction Consult Note  Recommendations:  No change to current therapy.  Monitor vitals, LFTs, and lytes.  Amiodarone is metabolized by the cytochrome P450 system and therefore has the potential to cause many drug interactions. Amiodarone has an average plasma half-life of 50 days (range 20 to 100 days).   There is potential for drug interactions to occur several weeks or months after stopping treatment and the onset of drug interactions may be slow after initiating amiodarone.   [x]  Statins: Increased risk of myopathy. Simvastatin- restrict dose to 20mg  daily. Other statins: counsel patients to report any muscle pain or weakness immediately.  []  Anticoagulants: Amiodarone can increase anticoagulant effect. Consider warfarin dose reduction. Patients should be monitored closely and the dose of anticoagulant altered accordingly, remembering that amiodarone levels take several weeks to stabilize.  []  Antiepileptics: Amiodarone can increase plasma concentration of phenytoin, the dose should be reduced. Note that small changes in phenytoin dose can result in large changes in levels. Monitor patient and counsel on signs of toxicity.  [x]  Beta blockers: increased risk of bradycardia, AV block and myocardial depression. Sotalol - avoid concomitant use.  []   Calcium channel blockers (diltiazem and verapamil): increased risk of bradycardia, AV block and myocardial depression.  []   Cyclosporine: Amiodarone increases levels of cyclosporine. Reduced dose of cyclosporine is recommended.  []  Digoxin dose should be halved when amiodarone is started.  [x]  Diuretics: increased risk of cardiotoxicity if hypokalemia occurs.  []  Oral hypoglycemic agents (glyburide, glipizide, glimepiride): increased risk of hypoglycemia. Patient's glucose levels should be monitored closely when initiating amiodarone therapy.   []  Drugs that prolong the QT interval:  Torsades de pointes risk may be  increased with concurrent use - avoid if possible.  Monitor QTc, also keep magnesium/potassium WNL if concurrent therapy can't be avoided. Marland Kitchen Antibiotics: e.g. fluoroquinolones, erythromycin. . Antiarrhythmics: e.g. quinidine, procainamide, disopyramide, sotalol. . Antipsychotics: e.g. phenothiazines, haloperidol.  . Lithium, tricyclic antidepressants, and methadone.  Theadore Blunck D. Laney Potash, PharmD, BCPS Pager:  [phone] 07/17/2015, 9:04 AM

## 2015-07-17 NOTE — Progress Notes (Signed)
Patient Name: Wesley Martinez Date of Encounter: 07/17/2015  Active Problems:   STEMI (ST elevation myocardial infarction) (HCC)   CAD (coronary artery disease)   Length of Stay: 5  SUBJECTIVE  The patient denies chest pain, SOB while walking. Looking positive.   CURRENT MEDS . acetaminophen  1,000 mg Oral 4 times per day   Or  . acetaminophen (TYLENOL) oral liquid 160 mg/5 mL  1,000 mg Per Tube 4 times per day  . amiodarone  400 mg Oral Q12H   Followed by  . [START ON 07/25/2015] amiodarone  400 mg Oral Daily  . antiseptic oral rinse  7 mL Mouth Rinse QID  . aspirin EC  325 mg Oral Daily   Or  . aspirin  324 mg Per Tube Daily  . atorvastatin  40 mg Oral QAC breakfast  . cefUROXime (ZINACEF)  IV  1.5 g Intravenous Q12H  . chlorhexidine gluconate  15 mL Mouth Rinse BID  . docusate sodium  200 mg Oral Daily  . insulin aspart  0-24 Units Subcutaneous 6 times per day  . metoprolol tartrate  12.5 mg Oral BID   Or  . metoprolol tartrate  12.5 mg Per Tube BID  . pantoprazole  40 mg Oral Daily  . sodium chloride flush  3 mL Intravenous Q12H  . [START ON 07/18/2015] sucralfate  1 g Oral QAC breakfast   OBJECTIVE  Filed Vitals:   07/17/15 0900 07/17/15 1000 07/17/15 1100 07/17/15 1200  BP: 108/80 106/75 130/84 122/74  Pulse: 100 93 97 93  Temp:    97.9 F (36.6 C)  TempSrc:    Oral  Resp: Height:      Weight:      SpO2: 97% 97% 98% 97%    Intake/Output Summary (Last 24 hours) at 07/17/15 1307 Last data filed at 07/17/15 1200  Gross per 24 hour  Intake  552.8 ml  Output   1910 ml  Net -1357.2 ml   Filed Weights   07/15/15 0200 07/16/15 0455 07/17/15 0500  Weight: 246 lb 0.5 oz (111.6 kg) 266 lb 12.1 oz (121 kg) 262 lb 2 oz (118.9 kg)   PHYSICAL EXAM  General: Pleasant, NAD. Neuro: Alert and oriented X 3. Moves all extremities spontaneously. Psych: Normal affect. HEENT:  Normal  Neck: Supple without bruits or JVD. Lungs:  Resp regular and unlabored,  CTA. Heart: RRR no s3, s4, 5/6 systolic murmur, no S2 Abdomen: Soft, non-tender, non-distended, BS + x 4.  Extremities: No clubbing, cyanosis or edema. DP/PT/Radials 2+ and equal bilaterally. R groin no bleeding at the IABP insertion site, good peripheral pulses  Accessory Clinical Findings  CBC  Recent Labs  07/16/15 1849 07/17/15 0420  WBC 12.0* 10.8*  HGB 10.2* 9.5*  HCT 30.4* 30.1*  MCV 78.8 78.4  PLT 95* 92*   Basic Metabolic Panel  Recent Labs  07/16/15 0255 07/16/15 1651 07/16/15 1849 07/17/15 0420 07/17/15 1051  NA 139 142  --  141  --   K 4.2 4.1  --  4.1  --   CL 111 106  --  110  --   CO2 19*  --   --  23  --   GLUCOSE 102* 137*  --  142*  --   BUN 20 21*  --  22*  --   CREATININE 1.17 1.10 1.09 1.04  --   CALCIUM 7.2*  --   --  7.8*  --   MG  2.7*  --  2.7*  --  2.9*   07/17/2015  CLINICAL DATA:  CABG and aortic valve replacement. EXAM: PORTABLE CHEST 1 VIEW COMPARISON:  07/16/2015 FINDINGS: Sternotomy wires unchanged. Patient slightly rotated to the right. Right IJ sheath in place with tip over the SVC. Left-sided chest tube unchanged. Lungs are adequately inflated with mild improved opacification in the left retrocardiac region likely atelectasis. No pneumothorax. Mild stable cardiomegaly. Remainder of the exam is unchanged. IMPRESSION: Mild improved left base opacification likely atelectasis. Mild stable cardiomegaly. Tubes and lines as described. Electronically Signed   By: Elberta Fortis M.D.   On: 07/17/2015 08:05   TELE: SR  cath: 07/12/2015  LM lesion, 70% stenosed.  Ost LAD to Prox LAD lesion, 80% stenosed.  Mid LAD lesion, 90% stenosed.  Prox Cx lesion, 90% stenosed.  Ost RCA to Prox RCA lesion, 95% stenosed.  Mid RCA lesion, 100% stenosed.  1st Mrg lesion, 99% stenosed.  1. Severe three-vessel and left main coronary artery disease. The coronary arteries are overall heavily calcified. The culprit as an occluded mid right coronary artery  which is heavily calcified with left-to-right collaterals. The LAD also wraps around the apex. The vessel is not optimal for PCI.  2. Severe aortic stenosis with a peak to peak gradient of 30 mmHg and calculated valve area of 0.8 cm  3. Severely elevated left ventricular end-diastolic pressure. Left ventricular angiography was not performed as the patient developed left bundle branch block with left ventricular engagement and LVEDP was severely elevated.  4. Right heart catheterization showed only mild pulmonary hypertension with a pulmonary pressure of 36/22. Wedge pressure was 24 mmHg and cardiac output was 4.4 with a cardiac index of 1.93.  5. Successful intra-aortic balloon pump placement.    ASSESSMENT AND PLAN  1. S/p emergent CABG x 4 and AVR on 07/15/2015 for STEMI with 3 VD and severe aortic stenosis. Doing great, mobilizing. - continue asa, atorvastatin, metoprolol.  2. Paroxysmal a-fib - very short episode cardioverted after initiation of amiodarone drip, no anticoagulation needed, finish current infusion and switch to PO amiodarone tonight, if no recurrence, continue PO amiodarone for 1 month.   3. 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 No LifeVest needed right now. Started on metoprolol. We can start low dose lisinopril 2.5 mh po daily and uptitrate as allowed by BP.  4. HLP - on atorvastatin.    Signed, Lars Masson MD, [facility] 07/17/2015

## 2015-07-17 NOTE — Progress Notes (Signed)
Patient ID: Wesley Martinez, male   DOB: [birth year], 70 y.o.   MRN: [id number] TCTS DAILY ICU PROGRESS NOTE                   [address]            [place] [address]          [phone]   2 Days Post-Op Procedure(s) (LRB): CORONARY ARTERY BYPASS GRAFTING (CABG) x four, using left greater saphenous vein harvested endoscopically (N/A) AORTIC VALVE REPLACEMENT (AVR) (N/A) TRANSESOPHAGEAL ECHOCARDIOGRAM (TEE) (N/A)  Total Length of Stay:  LOS: 5 days   Subjective: Awake and alert neuro inatct  Objective: Vital signs in last 24 hours: Temp:  [97.6 F (36.4 C)-99.5 F (37.5 C)] 98.5 F (36.9 C) (03/15 0412) Pulse Rate:  [49-109] 106 (03/15 0700) Cardiac Rhythm:  [-] Normal sinus rhythm;Sinus tachycardia (03/15 0400) Resp:  [13-38] 25 (03/15 0700) BP: (93-128)/(64-83) 123/72 mmHg (03/15 0700) SpO2:  [89 %-100 %] 95 % (03/15 0700) Arterial Line BP: (111-158)/(46-65) 122/53 mmHg (03/15 0200) Weight:  [262 lb 2 oz (118.9 kg)] 262 lb 2 oz (118.9 kg) (03/15 0500)  Filed Weights   07/15/15 0200 07/16/15 0455 07/17/15 0500  Weight: 246 lb 0.5 oz (111.6 kg) 266 lb 12.1 oz (121 kg) 262 lb 2 oz (118.9 kg)    Weight change: -4 lb 10.1 oz (-2.1 kg)   Hemodynamic parameters for last 24 hours: PAP: (33-43)/(16-22) 43/22 mmHg CO:  [6.3 L/min-7.8 L/min] 7.1 L/min CI:  [2.7 L/min/m2-3.4 L/min/m2] 3.1 L/min/m2  Intake/Output from previous day: 03/14 0701 - 03/15 0700 In: 699.6 [P.O.:120; I.V.:479.6; IV Piggyback:100] Out: 1655 [Urine:1285; Chest Tube:370]  Intake/Output this shift:    Current Meds: Scheduled Meds: . acetaminophen  1,000 mg Oral 4 times per day   Or  . acetaminophen (TYLENOL) oral liquid 160 mg/5 mL  1,000 mg Per Tube 4 times per day  . antiseptic oral rinse  7 mL Mouth Rinse QID  . aspirin EC  325 mg Oral Daily   Or  . aspirin  324 mg Per Tube Daily  . atorvastatin  40 mg Oral QAC breakfast  . cefUROXime (ZINACEF)  IV  1.5 g Intravenous Q12H  .  chlorhexidine gluconate  15 mL Mouth Rinse BID  . docusate sodium  200 mg Oral Daily  . insulin aspart  0-24 Units Subcutaneous 6 times per day  . metoprolol tartrate  12.5 mg Oral BID   Or  . metoprolol tartrate  12.5 mg Per Tube BID  . pantoprazole  40 mg Oral Daily  . sodium chloride flush  3 mL Intravenous Q12H  . [START ON 07/18/2015] sucralfate  1 g Oral QAC breakfast   Continuous Infusions: . sodium chloride Stopped (07/16/15 1500)  . sodium chloride    . sodium chloride Stopped (07/16/15 1300)  . dexmedetomidine Stopped (07/15/15 1900)  . DOPamine 2 mcg/kg/min (07/16/15 1900)  . lactated ringers 10 mL/hr at 07/16/15 1900  . lactated ringers    . nitroGLYCERIN Stopped (07/15/15 1611)  . phenylephrine (NEO-SYNEPHRINE) Adult infusion Stopped (07/16/15 0514)   PRN Meds:.sodium chloride, lactated ringers, metoprolol, midazolam, morphine injection, ondansetron (ZOFRAN) IV, oxyCODONE, sodium chloride flush, traMADol  General appearance: alert and cooperative Neurologic: intact Heart: regular rate and rhythm, S1, S2 normal, no murmur, click, rub or gallop Lungs: diminished breath sounds bibasilar Abdomen: soft, non-tender; bowel sounds normal; no masses,  no organomegaly Extremities: extremities normal, atraumatic, no cyanosis or edema and Homans sign  is negative, no sign of DVT Wound: stable  Lab Results: CBC: Recent Labs  07/16/15 1849 07/17/15 0420  WBC 12.0* 10.8*  HGB 10.2* 9.5*  HCT 30.4* 30.1*  PLT 95* 92*   BMET:  Recent Labs  07/16/15 0255 07/16/15 1651 07/16/15 1849 07/17/15 0420  NA 139 142  --  141  K 4.2 4.1  --  4.1  CL 111 106  --  110  CO2 19*  --   --  23  GLUCOSE 102* 137*  --  142*  BUN 20 21*  --  22*  CREATININE 1.17 1.10 1.09 1.04  CALCIUM 7.2*  --   --  7.8*    PT/INR:  Recent Labs  07/15/15 1716  LABPROT 20.9*  INR 1.81*   Radiology: Dg Chest Port 1 View  07/17/2015  CLINICAL DATA:  CABG and aortic valve replacement. EXAM:  PORTABLE CHEST 1 VIEW COMPARISON:  07/16/2015 FINDINGS: Sternotomy wires unchanged. Patient slightly rotated to the right. Right IJ sheath in place with tip over the SVC. Left-sided chest tube unchanged. Lungs are adequately inflated with mild improved opacification in the left retrocardiac region likely atelectasis. No pneumothorax. Mild stable cardiomegaly. Remainder of the exam is unchanged. IMPRESSION: Mild improved left base opacification likely atelectasis. Mild stable cardiomegaly. Tubes and lines as described. Electronically Signed   By: Elberta Fortis M.D.   On: 07/17/2015 08:05     Assessment/Plan: S/P Procedure(s) (LRB): CORONARY ARTERY BYPASS GRAFTING (CABG) x four, using left greater saphenous vein harvested endoscopically (N/A) AORTIC VALVE REPLACEMENT (AVR) (N/A) TRANSESOPHAGEAL ECHOCARDIOGRAM (TEE) (N/A) Mobilize Diuresis Diabetes control d/c tubes/lines Now in rapid a fib, start cordrone    Delight Ovens 07/17/2015 8:08 AM

## 2015-07-17 NOTE — Progress Notes (Signed)
Order for balloon pump and venous sheath removal verified per post procedural orders. Procedure explained to patient and Rt femoral artery access site assessed: level 0, palpable dorsalis pedis and posterior tibial pulses. 7.5 French Sheath and 22f pinnacle sheath removed and manual pressure applied for 30 minutes. Pre, peri, & post procedural vitals: HR 77, RR 12, O2 Sat upper 95, BP 110/70, Pain 0. Distal pulses remained intact after sheath removal. Access site level 0 and dressed with 4X4 gauze and tegaderm. RN confirmed condition of site. Post procedural instructions discussed with return demonstration from patient.

## 2015-07-18 ENCOUNTER — Inpatient Hospital Stay (HOSPITAL_COMMUNITY): Payer: Medicare Other

## 2015-07-18 LAB — GLUCOSE, CAPILLARY
GLUCOSE-CAPILLARY: 117 mg/dL — AB (ref 65–99)
GLUCOSE-CAPILLARY: 120 mg/dL — AB (ref 65–99)
GLUCOSE-CAPILLARY: 92 mg/dL (ref 65–99)
Glucose-Capillary: 122 mg/dL — ABNORMAL HIGH (ref 65–99)
Glucose-Capillary: 113 mg/dL — ABNORMAL HIGH (ref 65–99)

## 2015-07-18 LAB — TYPE AND SCREEN
ABO/RH(D): O POS
Antibody Screen: NEGATIVE
Unit division: 0
Unit division: 0
Unit division: 0
Unit division: 0

## 2015-07-18 LAB — BASIC METABOLIC PANEL
Anion gap: 7 (ref 5–15)
BUN: 26 mg/dL — ABNORMAL HIGH (ref 6–20)
CO2: 25 mmol/L (ref 22–32)
Calcium: 7.8 mg/dL — ABNORMAL LOW (ref 8.9–10.3)
Chloride: 107 mmol/L (ref 101–111)
Creatinine, Ser: 1.04 mg/dL (ref 0.61–1.24)
GFR calc Af Amer: >60 mL/min (ref >60)
GFR calc non Af Amer: >60 mL/min (ref >60)
Glucose, Bld: 127 mg/dL — ABNORMAL HIGH (ref 65–99)
Potassium: 3.8 mmol/L (ref 3.5–5.1)
Sodium: 139 mmol/L (ref 135–145)

## 2015-07-18 LAB — CBC
HCT: 28.4 % — ABNORMAL LOW (ref 39.0–52.0)
Hemoglobin: 9.2 g/dL — ABNORMAL LOW (ref 13.0–17.0)
MCH: 25.6 pg — ABNORMAL LOW (ref 26.0–34.0)
MCHC: 32.4 g/dL (ref 30.0–36.0)
MCV: 79.1 fL (ref 78.0–100.0)
Platelets: 120 10*3/uL — ABNORMAL LOW (ref 150–400)
RBC: 3.59 MIL/uL — ABNORMAL LOW (ref 4.22–5.81)
RDW: 15.9 % — ABNORMAL HIGH (ref 11.5–15.5)
WBC: 10.7 10*3/uL — ABNORMAL HIGH (ref 4.0–10.5)

## 2015-07-18 LAB — MAGNESIUM: Magnesium: 2.8 mg/dL — ABNORMAL HIGH (ref 1.7–2.4)

## 2015-07-18 MED ORDER — BISACODYL 10 MG RE SUPP: 10.0000 mg | Freq: Every day | RECTAL | Status: DC | PRN

## 2015-07-18 MED ORDER — MOVING RIGHT ALONG BOOK
Freq: Once | Status: AC
Start: 2015-07-18 — End: 2015-07-18
  Administered 2015-07-18: 09:00:00
  Filled 2015-07-18: qty 1

## 2015-07-18 MED ORDER — ASPIRIN EC 325 MG PO TBEC
325.0000 mg | DELAYED_RELEASE_TABLET | Freq: Every day | ORAL | Status: DC
  Administered 2015-07-18 – 2015-07-20 (×3): 325 mg via ORAL
  Filled 2015-07-18 (×4): qty 1

## 2015-07-18 MED ORDER — PROBENECID 500 MG PO TABS
500.0000 mg | ORAL_TABLET | Freq: Every day | ORAL | Status: DC
  Administered 2015-07-18 – 2015-07-20 (×3): 500 mg via ORAL
  Filled 2015-07-18 (×7): qty 1

## 2015-07-18 MED ORDER — GUAIFENESIN-DM 100-10 MG/5ML PO SYRP: 15.0000 mL | ORAL_SOLUTION | ORAL | Status: DC | PRN

## 2015-07-18 MED ORDER — ONDANSETRON HCL 4 MG PO TABS: 4.0000 mg | ORAL_TABLET | Freq: Four times a day (QID) | ORAL | Status: DC | PRN

## 2015-07-18 MED ORDER — OXYCODONE HCL 5 MG PO TABS: 5.0000 mg | ORAL_TABLET | ORAL | Status: DC | PRN

## 2015-07-18 MED ORDER — PANTOPRAZOLE SODIUM 40 MG PO TBEC
40.0000 mg | DELAYED_RELEASE_TABLET | Freq: Every day | ORAL | Status: DC
  Administered 2015-07-19 – 2015-07-20 (×2): 40 mg via ORAL
  Filled 2015-07-18: qty 2
  Filled 2015-07-18 (×2): qty 1

## 2015-07-18 MED ORDER — BISACODYL 5 MG PO TBEC: 10.0000 mg | DELAYED_RELEASE_TABLET | Freq: Every day | ORAL | Status: DC | PRN

## 2015-07-18 MED ORDER — SODIUM CHLORIDE 0.9 % IV SOLN: 250.0000 mL | INTRAVENOUS | Status: DC | PRN

## 2015-07-18 MED ORDER — METOPROLOL TARTRATE 12.5 MG HALF TABLET
12.5000 mg | ORAL_TABLET | Freq: Two times a day (BID) | ORAL | Status: DC
  Administered 2015-07-18 – 2015-07-20 (×6): 12.5 mg via ORAL
  Filled 2015-07-18 (×7): qty 1

## 2015-07-18 MED ORDER — SODIUM CHLORIDE 0.9% FLUSH: 3.0000 mL | INTRAVENOUS | Status: DC | PRN

## 2015-07-18 MED ORDER — TRAMADOL HCL 50 MG PO TABS
50.0000 mg | ORAL_TABLET | ORAL | Status: DC | PRN
  Administered 2015-07-19 (×2): 100 mg via ORAL
  Filled 2015-07-18 (×2): qty 2

## 2015-07-18 MED ORDER — ONDANSETRON HCL 4 MG/2ML IJ SOLN: 4.0000 mg | Freq: Four times a day (QID) | INTRAMUSCULAR | Status: DC | PRN

## 2015-07-18 MED ORDER — SODIUM CHLORIDE 0.9% FLUSH
3.0000 mL | Freq: Two times a day (BID) | INTRAVENOUS | Status: DC
  Administered 2015-07-18 – 2015-07-20 (×3): 3 mL via INTRAVENOUS

## 2015-07-18 MED ORDER — LOSARTAN POTASSIUM 50 MG PO TABS
50.0000 mg | ORAL_TABLET | Freq: Every day | ORAL | Status: DC
Start: 2015-07-18 — End: 2015-07-21
  Administered 2015-07-18 – 2015-07-20 (×3): 50 mg via ORAL
  Filled 2015-07-18 (×4): qty 1

## 2015-07-18 MED ORDER — ENOXAPARIN SODIUM 40 MG/0.4ML ~~LOC~~ SOLN
40.0000 mg | SUBCUTANEOUS | Status: DC
  Administered 2015-07-18 – 2015-07-20 (×3): 40 mg via SUBCUTANEOUS
  Filled 2015-07-18 (×3): qty 0.4

## 2015-07-18 MED ORDER — INSULIN ASPART 100 UNIT/ML ~~LOC~~ SOLN
0.0000 [IU] | Freq: Three times a day (TID) | SUBCUTANEOUS | Status: DC
  Administered 2015-07-19: 2 [IU] via SUBCUTANEOUS

## 2015-07-18 NOTE — Progress Notes (Signed)
Patient ID: Wesley Martinez, male   DOB: May 28, 1945, 70 y.o.   MRN: [id number]   SICU Evening Rounds:   Hemodynamically stable   Urine output good      A/P:  Stable postop course. Continue current plans

## 2015-07-18 NOTE — Progress Notes (Addendum)
Patient ID: Wesley Martinez, male   DOB: 1945/10/03, 70 y.o.   MRN: [id number] TCTS DAILY ICU PROGRESS NOTE                   [address]            [place] [address]          [phone]   3 Days Post-Op Procedure(s) (LRB): CORONARY ARTERY BYPASS GRAFTING (CABG) x four, using left greater saphenous vein harvested endoscopically (N/A) AORTIC VALVE REPLACEMENT (AVR) (N/A) TRANSESOPHAGEAL ECHOCARDIOGRAM (TEE) (N/A)  Total Length of Stay:  LOS: 6 days   Subjective: Alert neuro intact  Objective: Vital signs in last 24 hours: Temp:  [97.7 F (36.5 C)-99.4 F (37.4 C)] 99.4 F (37.4 C) (03/16 0802) Pulse Rate:  [75-106] 82 (03/16 0700) Cardiac Rhythm:  [-] Normal sinus rhythm (03/16 0400) Resp:  [11-31] 18 (03/16 0700) BP: (102-130)/(60-84) 120/74 mmHg (03/16 0700) SpO2:  [92 %-98 %] 92 % (03/16 0700) Weight:  [264 lb 12.4 oz (120.1 kg)] 264 lb 12.4 oz (120.1 kg) (03/16 0500)  Filed Weights   07/16/15 0455 07/17/15 0500 07/18/15 0500  Weight: 266 lb 12.1 oz (121 kg) 262 lb 2 oz (118.9 kg) 264 lb 12.4 oz (120.1 kg)    Weight change: 2 lb 10.3 oz (1.2 kg)   Hemodynamic parameters for last 24 hours:    Intake/Output from previous day: 03/15 0701 - 03/16 0700 In: 6745.7 [P.O.:420; I.V.:6275.7; IV Piggyback:50] Out: 1420 [Urine:1300; Chest Tube:120]  Intake/Output this shift:    Current Meds: Scheduled Meds: . acetaminophen  1,000 mg Oral 4 times per day   Or  . acetaminophen (TYLENOL) oral liquid 160 mg/5 mL  1,000 mg Per Tube 4 times per day  . amiodarone  400 mg Oral Q12H   Followed by  . [START ON 07/25/2015] amiodarone  400 mg Oral Daily  . antiseptic oral rinse  7 mL Mouth Rinse QID  . aspirin EC  325 mg Oral Daily   Or  . aspirin  324 mg Per Tube Daily  . atorvastatin  40 mg Oral QAC breakfast  . chlorhexidine gluconate  15 mL Mouth Rinse BID  . docusate sodium  200 mg Oral Daily  . insulin aspart  0-24 Units Subcutaneous 6 times per day  .  metoprolol tartrate  12.5 mg Oral BID   Or  . metoprolol tartrate  12.5 mg Per Tube BID  . pantoprazole  40 mg Oral Daily  . sodium chloride flush  3 mL Intravenous Q12H  . sucralfate  1 g Oral QAC breakfast   Continuous Infusions: . sodium chloride Stopped (07/16/15 1500)  . sodium chloride    . sodium chloride Stopped (07/16/15 1300)  . amiodarone Stopped (07/17/15 2000)  . dexmedetomidine Stopped (07/15/15 1900)  . lactated ringers 10 mL/hr at 07/18/15 0400  . lactated ringers    . nitroGLYCERIN Stopped (07/15/15 1611)  . phenylephrine (NEO-SYNEPHRINE) Adult infusion Stopped (07/16/15 0514)   PRN Meds:.sodium chloride, lactated ringers, metoprolol, midazolam, morphine injection, ondansetron (ZOFRAN) IV, oxyCODONE, sodium chloride flush, traMADol  General appearance: alert, cooperative and no distress Neurologic: intact Heart: regular rate and rhythm, S1, S2 normal, no murmur, click, rub or gallop Lungs: diminished breath sounds bibasilar Abdomen: soft, non-tender; bowel sounds normal; no masses,  no organomegaly Extremities: extremities normal, atraumatic, no cyanosis or edema and Homans sign is negative, no sign of DVT Wound: sternum stable  Lab Results: CBC: Recent Labs  07/17/15  0420 07/18/15 0450  WBC 10.8* 10.7*  HGB 9.5* 9.2*  HCT 30.1* 28.4*  PLT 92* 120*   BMET:  Recent Labs  07/17/15 0420 07/18/15 0450  NA 141 139  K 4.1 3.8  CL 110 107  CO2 23 25  GLUCOSE 142* 127*  BUN 22* 26*  CREATININE 1.04 1.04  CALCIUM 7.8* 7.8*    PT/INR:  Recent Labs  07/15/15 1716  LABPROT 20.9*  INR 1.81*   Radiology: Dg Chest Port 1 View  07/18/2015  CLINICAL DATA:  Chest tube removal. EXAM: PORTABLE CHEST 1 VIEW COMPARISON:  July 17, 2015. FINDINGS: Stable cardiomegaly. Status post coronary artery bypass graft. Left-sided chest tube noted on prior exam has been removed. No pneumothorax is noted. Mildly increased bibasilar opacities are noted most consistent with  subsegmental atelectasis. Hypoinflation of the lungs is noted. Bony thorax is unremarkable. IMPRESSION: Hypoinflation of the lungs with mildly increased bibasilar opacities most consistent with subsegmental atelectasis. Left-sided chest tube has been removed without evidence of pneumothorax. Electronically Signed   By: Lupita Raider, M.D.   On: 07/18/2015 08:05     Assessment/Plan: S/P Procedure(s) (LRB): CORONARY ARTERY BYPASS GRAFTING (CABG) x four, using left greater saphenous vein harvested endoscopically (N/A) AORTIC VALVE REPLACEMENT (AVR) (N/A) TRANSESOPHAGEAL ECHOCARDIOGRAM (TEE) (N/A) Mobilize Diuresis Plan for transfer to step-down: see transfer orders Renal function stable IAB site intact, non problems with feet  On Cordarone for post op Afib, holding sinuses Resume ARB    Delight Ovens 07/18/2015 8:39 AM

## 2015-07-19 ENCOUNTER — Encounter (HOSPITAL_COMMUNITY): Payer: Self-pay | Admitting: General Practice

## 2015-07-19 ENCOUNTER — Inpatient Hospital Stay (HOSPITAL_COMMUNITY): Payer: Medicare Other

## 2015-07-19 ENCOUNTER — Telehealth: Payer: Self-pay | Admitting: Cardiovascular Disease

## 2015-07-19 DIAGNOSIS — Z952 Presence of prosthetic heart valve: Secondary | ICD-10-CM

## 2015-07-19 DIAGNOSIS — Z951 Presence of aortocoronary bypass graft: Secondary | ICD-10-CM

## 2015-07-19 LAB — CBC
HCT: 28.8 % — ABNORMAL LOW (ref 39.0–52.0)
Hemoglobin: 9.3 g/dL — ABNORMAL LOW (ref 13.0–17.0)
MCH: 25.3 pg — ABNORMAL LOW (ref 26.0–34.0)
MCHC: 32.3 g/dL (ref 30.0–36.0)
MCV: 78.3 fL (ref 78.0–100.0)
Platelets: 164 10*3/uL (ref 150–400)
RBC: 3.68 MIL/uL — ABNORMAL LOW (ref 4.22–5.81)
RDW: 16.2 % — ABNORMAL HIGH (ref 11.5–15.5)
WBC: 10.6 10*3/uL — ABNORMAL HIGH (ref 4.0–10.5)

## 2015-07-19 LAB — BASIC METABOLIC PANEL
Anion gap: 8 (ref 5–15)
BUN: 26 mg/dL — ABNORMAL HIGH (ref 6–20)
CO2: 24 mmol/L (ref 22–32)
Calcium: 8 mg/dL — ABNORMAL LOW (ref 8.9–10.3)
Chloride: 108 mmol/L (ref 101–111)
Creatinine, Ser: 1.15 mg/dL (ref 0.61–1.24)
GFR calc Af Amer: >60 mL/min (ref >60)
GFR calc non Af Amer: >60 mL/min (ref >60)
Glucose, Bld: 105 mg/dL — ABNORMAL HIGH (ref 65–99)
Potassium: 3.8 mmol/L (ref 3.5–5.1)
Sodium: 140 mmol/L (ref 135–145)

## 2015-07-19 LAB — GLUCOSE, CAPILLARY
GLUCOSE-CAPILLARY: 98 mg/dL (ref 65–99)
GLUCOSE-CAPILLARY: 98 mg/dL (ref 65–99)
Glucose-Capillary: 122 mg/dL — ABNORMAL HIGH (ref 65–99)
Glucose-Capillary: 115 mg/dL — ABNORMAL HIGH (ref 65–99)

## 2015-07-19 NOTE — Care Management Important Message (Signed)
Important Message  Patient Details  Name: Wesley Martinez MRN: [id number] Date of Birth: 07/26/1945   Medicare Important Message Given:  Yes    Oralia Rud Tirrell Buchberger 07/19/2015, 2:21 PM

## 2015-07-19 NOTE — Discharge Instructions (Signed)
Coronary Artery Bypass Grafting, Care After °Refer to this sheet in the next few weeks. These instructions provide you with information on caring for yourself after your procedure. Your health care provider may also give you more specific instructions. Your treatment has been planned according to current medical practices, but problems sometimes occur. Call your health care provider if you have any problems or questions after your procedure. °WHAT TO EXPECT AFTER THE PROCEDURE °Recovery from surgery will be different for everyone. Some people feel well after 3 or 4 weeks, while for others it takes longer. After your procedure, it is typical to have the following: °· Nausea and a lack of appetite.   °· Constipation. °· Weakness and fatigue.   °· Depression or irritability.   °· Pain or discomfort at your incision site. °HOME CARE INSTRUCTIONS °· Take medicines only as directed by your health care provider. Do not stop taking medicines or start any new medicines without first checking with your health care provider. °· Take your pulse as directed by your health care provider. °· Perform deep breathing as directed by your health care provider. If you were given a device called an incentive spirometer, use it to practice deep breathing several times a day. Support your chest with a pillow or your arms when you take deep breaths or cough. °· Keep incision areas clean, dry, and protected. Remove or change any bandages (dressings) only as directed by your health care provider. You may have skin adhesive strips over the incision areas. Do not take the strips off. They will fall off on their own. °· Check incision areas daily for any swelling, redness, or drainage. °· If incisions were made in your legs, do the following: °¨ Avoid crossing your legs.   °¨ Avoid sitting for long periods of time. Change positions every 30 minutes.   °¨ Elevate your legs when you are sitting. °· Wear compression stockings as directed by your  health care provider. These stockings help keep blood clots from forming in your legs. °· Take showers once your health care provider approves. Until then, only take sponge baths. Pat incisions dry. Do not rub incisions with a washcloth or towel. Do not take baths, swim, or use a hot tub until your health care provider approves. °· Eat foods that are high in fiber, such as raw fruits and vegetables, whole grains, beans, and nuts. Meats should be lean cut. Avoid canned, processed, and fried foods. °· Drink enough fluid to keep your urine clear or pale yellow. °· Weigh yourself every day. This helps identify if you are retaining fluid that may make your heart and lungs work harder. °· Rest and limit activity as directed by your health care provider. You may be instructed to: °¨ Stop any activity at once if you have chest pain, shortness of breath, irregular heartbeats, or dizziness. Get help right away if you have any of these symptoms. °¨ Move around frequently for short periods or take short walks as directed by your health care provider. Increase your activities gradually. You may need physical therapy or cardiac rehabilitation to help strengthen your muscles and build your endurance. °¨ Avoid lifting, pushing, or pulling anything heavier than 10 lb (4.5 kg) for at least 6 weeks after surgery. °· Do not drive until your health care provider approves.  °· Ask your health care provider when you may return to work. °· Ask your health care provider when you may resume sexual activity. °· Keep all follow-up visits as directed by your health care   provider. This is important. °SEEK MEDICAL CARE IF: °· You have swelling, redness, increasing pain, or drainage at the site of an incision. °· You have a fever. °· You have swelling in your ankles or legs. °· You have pain in your legs.   °· You gain 2 or more pounds (0.9 kg) a day. °· You are nauseous or vomit. °· You have diarrhea.  °SEEK IMMEDIATE MEDICAL CARE IF: °· You have  chest pain that goes to your jaw or arms. °· You have shortness of breath.   °· You have a fast or irregular heartbeat.   °· You notice a "clicking" in your breastbone (sternum) when you move.   °· You have numbness or weakness in your arms or legs. °· You feel dizzy or light-headed.   °MAKE SURE YOU: °· Understand these instructions. °· Will watch your condition. °· Will get help right away if you are not doing well or get worse. °  °This information is not intended to replace advice given to you by your health care provider. Make sure you discuss any questions you have with your health care provider. °  °Document Released: 11/07/2004 Document Revised: 05/11/2014 Document Reviewed: 09/27/2012 °Elsevier Interactive Patient Education ©2016 [employer] ° °Endoscopic Saphenous Vein Harvesting, Care After °Refer to this sheet in the next few weeks. These instructions provide you with information on caring for yourself after your procedure. Your health care provider may also give you more specific instructions. Your treatment has been planned according to current medical practices, but problems sometimes occur. Call your health care provider if you have any problems or questions after your procedure. °HOME CARE INSTRUCTIONS °Medicine °· Take whatever pain medicine your surgeon prescribes. Follow the directions carefully. Do not take over-the-counter pain medicine unless your surgeon says it is okay. Some pain medicine can cause bleeding problems for several weeks after surgery. °· Follow your surgeon's instructions about driving. You will probably not be permitted to drive after heart surgery. °· Take any medicines your surgeon prescribes. Any medicines you took before your heart surgery should be checked with your health care provider before you start taking them again. °Wound care °· If your surgeon has prescribed an elastic bandage or stocking, ask how long you should wear it. °· Check the area around your surgical  cuts (incisions) whenever your bandages (dressings) are changed. Look for any redness or swelling. °· You will need to return to have the stitches (sutures) or staples taken out. Ask your surgeon when to do that. °· Ask your surgeon when you can shower or bathe. °Activity °· Try to keep your legs raised when you are sitting. °· Do any exercises your health care providers have given you. These may include deep breathing exercises, coughing, walking, or other exercises. °SEEK MEDICAL CARE IF: °· You have any questions about your medicines. °· You have more leg pain, especially if your pain medicine stops working. °· New or growing bruises develop on your leg. °· Your leg swells, feels tight, or becomes red. °· You have numbness in your leg. °SEEK IMMEDIATE MEDICAL CARE IF: °· Your pain gets much worse. °· Blood or fluid leaks from any of the incisions. °· Your incisions become warm, swollen, or red. °· You have chest pain. °· You have trouble breathing. °· You have a fever. °· You have more pain near your leg incision. °MAKE SURE YOU: °· Understand these instructions. °· Will watch your condition. °· Will get help right away if you are not doing well or   get worse. °  °This information is not intended to replace advice given to you by your health care provider. Make sure you discuss any questions you have with your health care provider. °  °Document Released: 12/31/2010 Document Revised: 05/11/2014 Document Reviewed: 12/31/2010 °Elsevier Interactive Patient Education ©2016 Elsevier Inc. ° ° °

## 2015-07-19 NOTE — Telephone Encounter (Signed)
lmov for patient to call back and schedule S/P CABG x 4/AVR

## 2015-07-19 NOTE — Discharge Summary (Signed)
Physician Discharge Summary  Patient ID: Wesley Martinez MRN: [id number] DOB/AGE: 1945-05-21 70 y.o.  Admit date: 07/12/2015 Discharge date: 07/21/2015  Admission Diagnoses:  Patient Active Problem List   Diagnosis Date Noted  . S/P AVR (aortic valve replacement) 07/19/2015  . 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 CAD (coronary artery disease) 07/15/2015  . STEMI (ST elevation myocardial infarction) (HCC) 07/12/2015  . ST elevation myocardial infarction (STEMI) (HCC)   . Aortic valve disease    Discharge Diagnoses:   Patient Active Problem List   Diagnosis Date Noted  . S/P AVR (aortic valve replacement) 07/19/2015  . 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 CAD (coronary artery disease) 07/15/2015  . STEMI (ST elevation myocardial infarction) (HCC) 07/12/2015  . ST elevation myocardial infarction (STEMI) (HCC)   . Aortic valve disease    Discharged Condition: good  History of Present Illness:  Wesley Martinez is a 70 yo male who presented to an Urgent care center in [facility] with complaints of nausea and vomiting.  He stated he developed chest pressure with indigestion/nausea for the past month.  Especially with ambulating down stairs and working in horse stalls.  The patient was at the grocery store purchasing items for a horse show at which time he became very nauseated.  He was going to call EMS, but he noticed urgent care when he left the store and presented there.  Workup at Urgent care showed EKG with Sinus Tach with ST elevation and he was transferred to the ED at [facility].   Code STEMI was activated and Cardiology consult was obtained and the patient was taken to the cath lab.  This showed severe 3 vessel CAD with LM involvement, IABP was placed and the patient was transferred to [facility] for further care.  Echocardiogram was completed and showed severe Aortic Stenosis.  It was felt surgery would be indicated and TCTS consult was placed.  He was evaluated by Dr. Tyrone Sage who was in agreement the patient would  benefit from CABG and AVR.  The risks and benefits of the procedure were explained to the patient and he was agreeable to proceed.  Hospital Course:   He was taken to the operating room on 07/15/2015 and underwent AVR with a 23 mm Pericardial Tissue valve, CABG x 4 utilizing LIMA to LAD, Sequential SVG to OM1 and OM2, and SVG to PDA, and endoscopic harvest of greater saphenous vein from left thigh.  He tolerated the procedure well and was taken to the SICU in stable condition.  He was extubated the evening of surgery.  During his stay in the SICU the patient was weaned off all drips as tolerated.  His IABP was removed on POD #1.  He developed Rapid Atrial Fibrillation and was treated with Amiodarone with successful conversion to NSR.  He was ambulating without much difficulty and felt medically stable for transfer to the step down unit on POD #3.  The patient continues to progress.  He continues to maintain NSR and his pacing wires have been removed without difficulty.  He continues to ambulate without difficulty.  He is tolerating a heart healthy diet.  He is felt medically stable for discharge home today.  Significant Diagnostic Studies: angiography:    LM lesion, 70% stenosed.  Ost LAD to Prox LAD lesion, 80% stenosed.  Mid LAD lesion, 90% stenosed.  Prox Cx lesion, 90% stenosed.  Ost RCA to Prox RCA lesion, 95% stenosed.  Mid RCA lesion, 100% stenosed.  1st Mrg lesion, 99%  stenosed.  Treatments: surgery:   Aortic valve replacement with a 23 pericardial tissue valve [employer], model 3300 TFX, serial [id number] and coronary artery bypass grafting x4 with left internal mammary to the left anterior descending coronary artery, reverse saphenous vein graft to the posterior descending coronary artery, sequential reverse saphenous vein graft to the first and second obtuse marginal coronary arteries with left greater saphenous vein thigh and calf endovein harvesting.  Disposition:  Home  Discharge Medications:  The patient has been discharged on:   1.Beta Blocker:  Yes [x   ]                              No   [   ]                              If No, reason:  2.Ace Inhibitor/ARB: Yes [ x  ]                                     No  [    ]                                     If No, reason:  3.Statin:   Yes [x   ]                  No  [   ]                  If No, reason:  4.Ecasa:  Yes  [  x ]                  No   [   ]                  If No, reason:      Discharge Instructions    Amb Referral to Cardiac Rehabilitation    Complete by:  As directed   Diagnosis:   Myocardial Infarction Comment - to [facility] CABG              Medication List    STOP taking these medications        diltiazem 240 MG 24 hr capsule  Commonly known as:  DILACOR XR     hydrochlorothiazide 25 MG tablet  Commonly known as:  HYDRODIURIL      TAKE these medications        amiodarone 400 MG tablet  Commonly known as:  PACERONE  Take 1 tablet (400 mg total) by mouth daily.  Start taking on:  07/25/2015     aspirin 325 MG EC tablet  Take 1 tablet (325 mg total) by mouth daily.     atorvastatin 40 MG tablet  Commonly known as:  LIPITOR  Take 40 mg by mouth at bedtime.     colchicine 0.6 MG tablet  Take 0.6 mg by mouth daily.     losartan 50 MG tablet  Commonly known as:  COZAAR  Take 50 mg by mouth daily.     metoprolol tartrate 25 MG tablet  Commonly known as:  LOPRESSOR  Take 0.5 tablets (12.5 mg total) by mouth 2 (two) times daily.  probenecid 500 MG tablet  Commonly known as:  BENEMID  Take 500 mg by mouth daily.     sucralfate 1 g tablet  Commonly known as:  CARAFATE  Take 1 g by mouth daily.     traMADol 50 MG tablet  Commonly known as:  ULTRAM  Take 1-2 tablets (50-100 mg total) by mouth every 4 (four) hours as needed for moderate pain.       Follow-up Information    Follow up with Delight Ovens, MD On 08/22/2015.   Specialty:   Cardiothoracic Surgery   Why:  Appoitnment is at 12:30   Contact information:   [address] [employer] [address] [place] [address] [phone]       Follow up with [facility] On 08/22/2015.   Why:  Please get CXR at 12:00   Contact information:   [place]       Follow up with Lorine Bears, MD.   Specialty:  Cardiology   Why:  office will contact you with appointment   Contact information:   [address] [place] [address] [phone]       Signed: Lowella Dandy 07/21/2015, 9:48 AM

## 2015-07-19 NOTE — Progress Notes (Addendum)
CARDIAC REHAB PHASE I   PRE:  Rate/Rhythm: 76 SR    BP: sitting 110/69    SaO2: 95 RA  MODE:  Ambulation: 250 ft   POST:  Rate/Rhythm: 93 SR    BP: sitting 107/71     SaO2: 100 RA  Pt doing well. Able to get up independently and walk with RW. C/o slight lightheadedness and fatigue. To recliner. Pt would like wife here for education. Gave him diet sheets and video to watch. Voiced understanding. Sts he drinks 3-4 Pepsis a day. Encouraged him to cut these out to reduce Hbg A1C. Pt requests his referral be sent to [facility]. Pt is still deciding if he would like a RW for home. [phone]   Harriet Masson CES, ACSM 07/19/2015 2:43 PM

## 2015-07-19 NOTE — Progress Notes (Signed)
Patient ID: Dontell Mian, male   DOB: Oct 14, 1945, 70 y.o.   MRN: [id number] TCTS DAILY ICU PROGRESS NOTE                   [address]            [place] [address]          [phone]   4 Days Post-Op Procedure(s) (LRB): CORONARY ARTERY BYPASS GRAFTING (CABG) x four, using left greater saphenous vein harvested endoscopically (N/A) AORTIC VALVE REPLACEMENT (AVR) (N/A) TRANSESOPHAGEAL ECHOCARDIOGRAM (TEE) (N/A)  Total Length of Stay:  LOS: 7 days  posto op day #4  Subjective: Feels well today   Objective: Vital signs in last 24 hours: Temp:  [97.4 F (36.3 C)-98.8 F (37.1 C)] 98.4 F (36.9 C) (03/17 0410) Pulse Rate:  [73-128] 82 (03/17 0700) Cardiac Rhythm:  [-] Normal sinus rhythm (03/17 0400) Resp:  [13-27] 21 (03/17 0700) BP: (91-128)/(58-78) 112/67 mmHg (03/17 0700) SpO2:  [90 %-100 %] 95 % (03/17 0700) Weight:  [260 lb 12.9 oz (118.3 kg)] 260 lb 12.9 oz (118.3 kg) (03/17 0500)  Filed Weights   07/17/15 0500 07/18/15 0500 07/19/15 0500  Weight: 262 lb 2 oz (118.9 kg) 264 lb 12.4 oz (120.1 kg) 260 lb 12.9 oz (118.3 kg)    Weight change: -3 lb 15.5 oz (-1.8 kg)   Hemodynamic parameters for last 24 hours:    Intake/Output from previous day: 03/16 0701 - 03/17 0700 In: 368 [P.O.:360; I.V.:8] Out: 875 [Urine:875]  Intake/Output this shift:    Current Meds: Scheduled Meds: . amiodarone  400 mg Oral Q12H   Followed by  . [START ON 07/25/2015] amiodarone  400 mg Oral Daily  . aspirin EC  325 mg Oral Daily  . atorvastatin  40 mg Oral QAC breakfast  . enoxaparin (LOVENOX) injection  40 mg Subcutaneous Q24H  . insulin aspart  0-24 Units Subcutaneous TID AC & HS  . losartan  50 mg Oral Daily  . metoprolol tartrate  12.5 mg Oral BID  . pantoprazole  40 mg Oral QAC breakfast  . probenecid  500 mg Oral Daily  . sodium chloride flush  3 mL Intravenous Q12H  . sucralfate  1 g Oral QAC breakfast   Continuous Infusions:  PRN Meds:.sodium chloride,  bisacodyl **OR** bisacodyl, guaiFENesin-dextromethorphan, ondansetron **OR** ondansetron (ZOFRAN) IV, oxyCODONE, sodium chloride flush, traMADol  General appearance: alert and cooperative Neurologic: intact Heart: regular rate and rhythm, S1, S2 normal, no murmur, click, rub or gallop Lungs: diminished breath sounds bibasilar Abdomen: soft, non-tender; bowel sounds normal; no masses,  no organomegaly Extremities: extremities normal, atraumatic, no cyanosis or edema and Homans sign is negative, no sign of DVT Wound: sternum intact  Lab Results: CBC: Recent Labs  07/18/15 0450 07/19/15 0516  WBC 10.7* 10.6*  HGB 9.2* 9.3*  HCT 28.4* 28.8*  PLT 120* 164   BMET:  Recent Labs  07/18/15 0450 07/19/15 0516  NA 139 140  K 3.8 3.8  CL 107 108  CO2 25 24  GLUCOSE 127* 105*  BUN 26* 26*  CREATININE 1.04 1.15  CALCIUM 7.8* 8.0*    PT/INR: No results for input(s): LABPROT, INR in the last 72 hours. Radiology: No results found.   Assessment/Plan: S/P Procedure(s) (LRB): CORONARY ARTERY BYPASS GRAFTING (CABG) x four, using left greater saphenous vein harvested endoscopically (N/A) AORTIC VALVE REPLACEMENT (AVR) (N/A) TRANSESOPHAGEAL ECHOCARDIOGRAM (TEE) (N/A) Waiting for step down bed x 24 hours  On arb,statin ,betablocker ,  30% ef prop , acute anterior apical transmural MI preop Anticipate d/c sunday   Delight Ovens 07/19/2015 8:10 AM

## 2015-07-19 NOTE — Progress Notes (Signed)
Utilization review completed.  

## 2015-07-20 LAB — GLUCOSE, CAPILLARY
GLUCOSE-CAPILLARY: 108 mg/dL — AB (ref 65–99)
GLUCOSE-CAPILLARY: 110 mg/dL — AB (ref 65–99)
Glucose-Capillary: 99 mg/dL (ref 65–99)
Glucose-Capillary: 98 mg/dL (ref 65–99)

## 2015-07-20 LAB — BASIC METABOLIC PANEL
Anion gap: 10 (ref 5–15)
BUN: 23 mg/dL — ABNORMAL HIGH (ref 6–20)
CO2: 24 mmol/L (ref 22–32)
Calcium: 8 mg/dL — ABNORMAL LOW (ref 8.9–10.3)
Chloride: 107 mmol/L (ref 101–111)
Creatinine, Ser: 1.28 mg/dL — ABNORMAL HIGH (ref 0.61–1.24)
GFR calc Af Amer: >60 mL/min (ref >60)
GFR calc non Af Amer: 55 mL/min — ABNORMAL LOW (ref >60)
Glucose, Bld: 108 mg/dL — ABNORMAL HIGH (ref 65–99)
Potassium: 3.6 mmol/L (ref 3.5–5.1)
Sodium: 141 mmol/L (ref 135–145)

## 2015-07-20 LAB — CBC
HCT: 29 % — ABNORMAL LOW (ref 39.0–52.0)
Hemoglobin: 9.2 g/dL — ABNORMAL LOW (ref 13.0–17.0)
MCH: 25.3 pg — ABNORMAL LOW (ref 26.0–34.0)
MCHC: 31.7 g/dL (ref 30.0–36.0)
MCV: 79.7 fL (ref 78.0–100.0)
Platelets: 207 10*3/uL (ref 150–400)
RBC: 3.64 MIL/uL — ABNORMAL LOW (ref 4.22–5.81)
RDW: 16.6 % — ABNORMAL HIGH (ref 11.5–15.5)
WBC: 10.3 10*3/uL (ref 4.0–10.5)

## 2015-07-20 NOTE — Progress Notes (Addendum)
      [address]       [place] [address]             [phone]      5 Days Post-Op Procedure(s) (LRB): CORONARY ARTERY BYPASS GRAFTING (CABG) x four, using left greater saphenous vein harvested endoscopically (N/A) AORTIC VALVE REPLACEMENT (AVR) (N/A) TRANSESOPHAGEAL ECHOCARDIOGRAM (TEE) (N/A)   Subjective:  Wesley Martinez has some mild chest discomfort this morning.  He otherwise is doing well.  + ambulation + BM  Objective: Vital signs in last 24 hours: Temp:  [97.6 F (36.4 C)-98.3 F (36.8 C)] 97.6 F (36.4 C) (03/18 0547) Pulse Rate:  [84-90] 90 (03/18 0547) Cardiac Rhythm:  [-] Normal sinus rhythm (03/18 0837) Resp:  [7-20] 20 (03/18 0547) BP: (113-120)/(65-67) 120/65 mmHg (03/18 0547) SpO2:  [99 %-100 %] 100 % (03/18 0547) Weight:  [260 lb 6.4 oz (118.117 kg)] 260 lb 6.4 oz (118.117 kg) (03/18 0547)  Intake/Output from previous day: 03/17 0701 - 03/18 0700 In: 836 [P.O.:836] Out: 750 [Urine:750]  General appearance: alert, cooperative and no distress Heart: regular rate and rhythm Lungs: clear to auscultation bilaterally Abdomen: soft, non-tender; bowel sounds normal; no masses,  no organomegaly Extremities: edema trac Wound: clean and dry  Lab Results:  Recent Labs  07/19/15 0516 07/20/15 0330  WBC 10.6* 10.3  HGB 9.3* 9.2*  HCT 28.8* 29.0*  PLT 164 207   BMET:  Recent Labs  07/19/15 0516 07/20/15 0330  NA 140 141  K 3.8 3.6  CL 108 107  CO2 24 24  GLUCOSE 105* 108*  BUN 26* 23*  CREATININE 1.15 1.28*  CALCIUM 8.0* 8.0*    PT/INR: No results for input(s): LABPROT, INR in the last 72 hours. ABG    Component Value Date/Time   PHART 7.312* 07/15/2015 2316   HCO3 19.4* 07/15/2015 2316   TCO2 21 07/16/2015 1651   ACIDBASEDEF 6.0* 07/15/2015 2316   O2SAT 63.0 07/17/2015 0412   CBG (last 3)   Recent Labs  07/19/15 1702 07/19/15 2040 07/20/15 0554  GLUCAP 98 98 108*    Assessment/Plan: S/P Procedure(s)  (LRB): CORONARY ARTERY BYPASS GRAFTING (CABG) x four, using left greater saphenous vein harvested endoscopically (N/A) AORTIC VALVE REPLACEMENT (AVR) (N/A) TRANSESOPHAGEAL ECHOCARDIOGRAM (TEE) (N/A)  1. CV- hemodynamically stable- on Lopressor, Amiodarone, home dose of Cozaar 2. Pulm- no acute issues, continue IS 3. Renal- creatinine slowing rising today at 1.28, not on diuretics, will repeat in AM and if continues to rise may need to stop Cozaar until kidneys recover 4. Cbgs controlled, not a diabetic will d/c SSIP 5. Dispo- patient stable, will d/c EPW today, repeat BMET in AM  LOS: 8 days    BARRETT, ERIN 07/20/2015  I have seen and examined the patient and agree with the assessment and plan as outlined.  Purcell Nails, MD 07/20/2015 8:02 PM

## 2015-07-20 NOTE — Progress Notes (Signed)
dc'ed pacing wires pt. tolerated well 

## 2015-07-20 NOTE — Progress Notes (Signed)
CARDIAC REHAB PHASE I   PRE:  Rate/Rhythm: 92 SR  BP:  Supine:   Sitting: 115/71  Standing:    SaO2: 98  MODE:  Ambulation: 350 ft   POST:  Rate/Rhythm: 114 ST  BP:  Supine:   Sitting: 123/75  Standing:    SaO2: 99 RA 1035-1130 Assisted X 1 and used walker to ambulate. Gait steady. VS stable. Pt able to walk 350 feet without c/o. Completed discharge education with pt and wife.They voice understanding. Pt is going to watch [facility] discharge video today.  Melina Copa RN 07/20/2015 11:29 AM

## 2015-07-21 LAB — BASIC METABOLIC PANEL
ANION GAP: 11 (ref 5–15)
BUN: 19 mg/dL (ref 6–20)
CO2: 24 mmol/L (ref 22–32)
Calcium: 8.2 mg/dL — ABNORMAL LOW (ref 8.9–10.3)
Chloride: 104 mmol/L (ref 101–111)
Creatinine, Ser: 1.17 mg/dL (ref 0.61–1.24)
GFR calc Af Amer: >60 mL/min (ref >60)
Glucose, Bld: 145 mg/dL — ABNORMAL HIGH (ref 65–99)
POTASSIUM: 3.7 mmol/L (ref 3.5–5.1)
SODIUM: 139 mmol/L (ref 135–145)

## 2015-07-21 LAB — GLUCOSE, CAPILLARY: Glucose-Capillary: 101 mg/dL — ABNORMAL HIGH (ref 65–99)

## 2015-07-21 MED ORDER — METOPROLOL TARTRATE 25 MG PO TABS: 12.5000 mg | ORAL_TABLET | Freq: Two times a day (BID) | ORAL | Status: DC

## 2015-07-21 MED ORDER — ASPIRIN 325 MG PO TBEC: 325.0000 mg | DELAYED_RELEASE_TABLET | Freq: Every day | ORAL | Status: AC

## 2015-07-21 MED ORDER — TRAMADOL HCL 50 MG PO TABS: 50.0000 mg | ORAL_TABLET | ORAL | Status: DC | PRN

## 2015-07-21 MED ORDER — AMIODARONE HCL 400 MG PO TABS: 400.0000 mg | ORAL_TABLET | Freq: Every day | ORAL | Status: DC

## 2015-07-21 NOTE — Accreditation Note (Signed)
Discharged to home with family office visits in place teaching done  

## 2015-07-21 NOTE — Progress Notes (Addendum)
      [address]       [place] [address]             [phone]      6 Days Post-Op Procedure(s) (LRB): CORONARY ARTERY BYPASS GRAFTING (CABG) x four, using left greater saphenous vein harvested endoscopically (N/A) AORTIC VALVE REPLACEMENT (AVR) (N/A) TRANSESOPHAGEAL ECHOCARDIOGRAM (TEE) (N/A)   Subjective:  Wesley Martinez has no complaints.  He is hoping to go home today.  + ambulation  + BM  Objective: Vital signs in last 24 hours: Temp:  [97.6 F (36.4 C)-98.4 F (36.9 C)] 98.4 F (36.9 C) (03/19 0459) Pulse Rate:  [79-83] 81 (03/19 0459) Cardiac Rhythm:  [-] Normal sinus rhythm (03/18 1939) Resp:  [18-20] 18 (03/19 0459) BP: (97-125)/(60-80) 117/80 mmHg (03/19 0459) SpO2:  [96 %-98 %] 98 % (03/19 0459) Weight:  [259 lb 9.6 oz (117.754 kg)] 259 lb 9.6 oz (117.754 kg) (03/19 0459)  Intake/Output from previous day: 03/18 0701 - 03/19 0700 In: 326 [P.O.:326] Out: 850 [Urine:850] Intake/Output this shift: Total I/O In: -  Out: 200 [Urine:200]  General appearance: alert, cooperative and no distress Heart: regular rate and rhythm Lungs: clear to auscultation bilaterally Abdomen: soft, non-tender; bowel sounds normal; no masses,  no organomegaly Extremities: edema trace Wound: clean and dry  Lab Results:  Recent Labs  07/19/15 0516 07/20/15 0330  WBC 10.6* 10.3  HGB 9.3* 9.2*  HCT 28.8* 29.0*  PLT 164 207   BMET:  Recent Labs  07/19/15 0516 07/20/15 0330  NA 140 141  K 3.8 3.6  CL 108 107  CO2 24 24  GLUCOSE 105* 108*  BUN 26* 23*  CREATININE 1.15 1.28*  CALCIUM 8.0* 8.0*    PT/INR: No results for input(s): LABPROT, INR in the last 72 hours. ABG    Component Value Date/Time   PHART 7.312* 07/15/2015 2316   HCO3 19.4* 07/15/2015 2316   TCO2 21 07/16/2015 1651   ACIDBASEDEF 6.0* 07/15/2015 2316   O2SAT 63.0 07/17/2015 0412   CBG (last 3)   Recent Labs  07/20/15 1635 07/20/15 2120 07/21/15 0642  GLUCAP 99 98 101*     Assessment/Plan: S/P Procedure(s) (LRB): CORONARY ARTERY BYPASS GRAFTING (CABG) x four, using left greater saphenous vein harvested endoscopically (N/A) AORTIC VALVE REPLACEMENT (AVR) (N/A) TRANSESOPHAGEAL ECHOCARDIOGRAM (TEE) (N/A)  1. CV- hemodynamically stable, continue Amiodarone, Lopressor, and Cozaar 2. Pulm- no acute issues, continue IS at discharge 3. Renal- creatinine down to 1.17 4. Dispo- patient is stable, awaiting repeat creatinine level if stable or improved, will d/c patient home today   LOS: 9 days    Wesley Martinez 07/21/2015

## 2015-08-21 ENCOUNTER — Other Ambulatory Visit: Payer: Self-pay | Admitting: Cardiothoracic Surgery

## 2015-08-21 DIAGNOSIS — Z951 Presence of aortocoronary bypass graft: Secondary | ICD-10-CM

## 2015-08-22 ENCOUNTER — Encounter: Payer: Self-pay | Admitting: Cardiothoracic Surgery

## 2015-08-22 ENCOUNTER — Ambulatory Visit
Admission: RE | Admit: 2015-08-22 | Discharge: 2015-08-22 | Disposition: A | Discharge status: Home / Self Care | Payer: Medicare Other | Source: Ambulatory Visit | Attending: Cardiothoracic Surgery | Admitting: Cardiothoracic Surgery

## 2015-08-22 ENCOUNTER — Ambulatory Visit (INDEPENDENT_AMBULATORY_CARE_PROVIDER_SITE_OTHER): Payer: Self-pay | Admitting: Cardiothoracic Surgery

## 2015-08-22 VITALS — BP 123/75 | HR 77 | Resp 16 | Ht 71.0 in | Wt 231.6 lb

## 2015-08-22 DIAGNOSIS — Z952 Presence of prosthetic heart valve: Secondary | ICD-10-CM

## 2015-08-22 DIAGNOSIS — I350 Nonrheumatic aortic (valve) stenosis: Secondary | ICD-10-CM

## 2015-08-22 DIAGNOSIS — Z951 Presence of aortocoronary bypass graft: Secondary | ICD-10-CM

## 2015-08-22 DIAGNOSIS — Z954 Presence of other heart-valve replacement: Secondary | ICD-10-CM

## 2015-08-22 DIAGNOSIS — I2510 Atherosclerotic heart disease of native coronary artery without angina pectoris: Secondary | ICD-10-CM

## 2015-08-22 NOTE — Progress Notes (Signed)
[address]       [place] [address]             [phone]      Danen Nicanor [facility] Medical Record #[id number] Date of Birth: 12/04/1945  Referring: Iran Ouch, MD Primary Care: Asencion Partridge, MD  Chief Complaint:   POST OP FOLLOW UP 07/15/2015 OPERATIVE REPORT PREOPERATIVE DIAGNOSIS: Severe aortic stenosis and coronary occlusive disease with recent anterior apical myocardial infarction. POSTOPERATIVE DIAGNOSES: Severe aortic stenosis and coronary occlusive disease with recent anterior apical myocardial infarction. 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 SURGEON: Sheliah Plane, MD  History of Present Illness:     Patient's made excellent progress after recent myocardial infarction in the setting of critical aortic stenosis and urgent coronary artery bypass grafting and aortic valve replacement. He's very rapidly returned to near-normal activity. He's had no recurrent angina or evidence of congestive heart failure. He is enrolled in cardiac rehabilitation through [facility] because this is closer to his home and will start next week.    Past Medical History  Diagnosis Date  . Gastric acidity   . Hypertension   . Hypercholesteremia   . Gout      History  Smoking status  . Never Smoker   Smokeless tobacco  . Not on file    History  Alcohol Use  . 0.6 oz/week  . 1 Glasses of wine per week    Comment: Occ.     No Known Allergies  Current Outpatient Prescriptions  Medication  Sig Dispense Refill  . amiodarone (PACERONE) 400 MG tablet Take 1 tablet (400 mg total) by mouth daily. 30 tablet 0  . aspirin EC 325 MG EC tablet Take 1 tablet (325 mg total) by mouth daily. 30 tablet 0  . atorvastatin (LIPITOR) 40 MG tablet Take 40 mg by mouth at bedtime.    . colchicine 0.6 MG tablet Take 0.6 mg by mouth daily.    Marland Kitchen losartan (COZAAR) 50 MG tablet Take 50 mg by mouth daily.     . metoprolol tartrate (LOPRESSOR) 25 MG tablet Take 0.5 tablets (12.5 mg total) by mouth 2 (two) times daily. 30 tablet 3  . probenecid (BENEMID) 500 MG tablet Take 500 mg by mouth daily.     . sucralfate (CARAFATE) 1 g tablet Take 1 g by mouth daily.      No current facility-administered medications for this visit.       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 Hypertension. History of aortic valve disease EXAM: CHEST  2 VIEW COMPARISON:  July 19, 2015 FINDINGS: There is no edema or consolidation. Heart is borderline enlarged with pulmonary vascularity within normal limits. Patient is status post coronary artery bypass grafting and aortic valve replacement. No adenopathy. There is postoperative change in the right shoulder. There is mild degenerative change in the  thoracic spine. IMPRESSION: Heart borderline enlarged with evidence of prior coronary artery bypass grafting and aortic valve replacement. No edema or consolidation. Electronically Signed   By: Bretta Bang III M.D.   On: 08/22/2015 11:34      Recent Lab Findings: Lab Results  Component Value Date   WBC 10.3 07/20/2015   HGB 9.2* 07/20/2015   HCT 29.0* 07/20/2015   PLT 207 07/20/2015   GLUCOSE 145* 07/21/2015   CHOL 108 07/13/2015   TRIG 82 07/13/2015   HDL 28* 07/13/2015   LDLCALC 64 07/13/2015   ALT 29 07/17/2015   AST 38 07/17/2015   NA 139 07/21/2015   K 3.7 07/21/2015   CL 104 07/21/2015   CREATININE 1.17 07/21/2015   BUN 19 07/21/2015   CO2 24 07/21/2015   TSH 1.367 07/17/2015   INR 1.81* 07/15/2015   HGBA1C 6.3* 07/15/2015      Assessment / Plan:      Patient making good postoperative progress following recent urgent coronary artery bypass grafting and aortic valve replacement with pericardial tissue valve. Patient has no signs or symptoms of congestive heart failure, does have evidence of cardiomegaly on chest x-ray and echocardiogram- with preop  moderate  focal basal and mild concentric hypertrophy of the left ventricle. Systolic function was moderately to severely reduced. The estimated ejection fraction was in the range of 30% to 35% He is currently on, aspirin, statin, ARB, beta blocker, and amiodarone for brief postoperative atrial fibrillation without recurrence.  He has his first cardiology appointment next week  Plan to see him back in 3 months   With the patient's prosthetic heart valve the risks of endocarditis have been discussed. The recommendations for periprocedural antibiotics including dental procedures have been discussed with the patient.  Delight Ovens MD      [address] [place] [address] Office [phone]   Beeper [phone]  08/22/2015 1:05 PM

## 2015-08-22 NOTE — Patient Instructions (Addendum)
Endocarditis Information  You may be at risk for developing endocarditis since you have  an artificial heart valve  or a repaired heart valve. Endocarditis is an infection of the lining of the heart or heart valves.   Certain surgical and dental procedures may put you at risk,  such as teeth cleaning or other dental procedures or any surgery involving the respiratory, urinary, gastrointestinal tract, gallbladder or prostate.   Notify your doctor or dentist before having any invasive procedures. You will need to take antibiotics before certain procedures.   To prevent endocarditis, maintain good oral health. Seek prompt medical attention for any mouth/gum, skin or urinary tract infections.        [address]       [place] [address]             [phone]       Coronary Artery Bypass Grafting  Care After  Refer to this sheet in the next few weeks. These instructions provide you with information on caring for yourself after your procedure. Your caregiver may also give you more specific instructions. Your treatment has been planned according to current medical practices, but problems sometimes occur. Call your caregiver if you have any problems or questions after your procedure.  Recovery from open heart surgery will be different for everyone. Some people feel well after 3 or 4 weeks, while for others it takes longer. After heart surgery, it may be normal to:  Not have an appetite, feel nauseated by the smell of food, or only want to eat a small amount.   Be constipated because of changes in your diet, activity, and medicines. Eat foods high in fiber. Add fresh fruits and vegetables to your diet. Stool softeners may be helpful.   Feel sad or unhappy. You may be frustrated or cranky. You may have good days and bad days. Do not give up. Talk to your caregiver if you do not feel better.   Feel weakness and fatigue. You many need physical therapy or cardiac rehabilitation  to get your strength back.   Develop an irregular heartbeat called atrial fibrillation. Symptoms of atrial fibrillation are a fast, irregular heartbeat or feelings of fluttery heartbeats, shortness of breath, low blood pressure, and dizziness. If these symptoms develop, see your caregiver right away.  MEDICATION  Have a list of all the medicines you will be taking when you leave the hospital. For every medicine, know the following:   Name.   Exact dose.   Time of day to be taken.   How often it should be taken.   Why you are taking it.   Ask which medicines should or should not be taken together. If you take more than one heart medicine, ask if it is okay to take them together. Some heart medicines should not be taken at the same time because they may lower your blood pressure too much.   Narcotic pain medicine can cause constipation. Eat fresh fruits and vegetables. Add fiber to your diet. Stool softener medicine may help relieve constipation.   Keep a copy of your medicines with you at all times.   Do not add or stop taking any medicine until you check with your caregiver.   Medicines can have side effects. Call your caregiver who prescribed the medicine if you:   Start throwing up, have diarrhea, or have stomach pain.   Feel dizzy or lightheaded when you stand up.   Feel your heart is skipping beats  or is beating too fast or too slow.   Develop a rash.   Notice unusual bruising or bleeding.  HOME CARE INSTRUCTIONS  After heart surgery, it is important to learn how to take your pulse. Have your caregiver show you how to take your pulse.   Use your incentive spirometer. Ask your caregiver how long after surgery you need to use it.  Care of your chest incision  Tell your caregiver right away if you notice clicking in your chest (sternum).   Support your chest with a pillow or your arms when you take deep breaths and cough.   Follow your caregiver's instructions about  when you can bathe or swim.   Protect your incision from sunlight during the first year to keep the scar from getting dark.   Tell your caregiver if you notice:   Increased tenderness of your incision.   Increased redness or swelling around your incision.   Drainage or pus from your incision.  Care of your leg incision(s)  Avoid crossing your legs.   Avoid sitting for long periods of time. Change positions every half hour.   Elevate your leg(s) when you are sitting.   Check your leg(s) daily for swelling. Check the incisions for redness or drainage.   Diet is very important to heart health.   Eat plenty of fresh fruits and vegetables. Meats should be lean cut. Avoid canned, processed, and fried foods.   Talk to a dietician. They can teach you how to make healthy food and drink choices.  Weight  Weigh yourself every day. This is important because it helps to know if you are retaining fluid that may make your heart and lungs work harder.   Use the same scale each time.   Weigh yourself every morning at the same time. You should do this after you go to the bathroom, but before you eat breakfast.   Your weight will be more accurate if you do not wear any clothes.   Record your weight.   Tell your caregiver if you have gained 2 pounds or more overnight.  Activity Stop any activity at once if you have chest pain, shortness of breath, irregular heartbeats, or dizziness. Get help right away if you have any of these symptoms.  Bathing.  Avoid soaking in a bath or hot tub until your incisions are healed.   Rest. You need a balance of rest and activity.   Exercise. Exercise per your caregiver's advice. You may need physical therapy or cardiac rehabilitation to help strengthen your muscles and build your endurance.   Climbing stairs. Unless your caregiver tells you not to climb stairs, go up stairs slowly and rest if you tire. Do not pull yourself up by the handrail.   Driving a  car. Follow your caregiver's advice on when you may drive. You may ride as a passenger at any time. When traveling for long periods of time in a car, get out of the car and walk around for a few minutes every 2 hours.   Lifting. Avoid lifting, pushing, or pulling anything heavier than 10 pounds for 6 weeks after surgery or as told by your caregiver.   Returning to work. Check with your caregiver. People heal at different rates. Most people will be able to go back to work 6 to 12 weeks after surgery.   Sexual activity. You may resume sexual relations as told by your caregiver.  SEEK MEDICAL CARE IF:  Any of your incisions are  red, painful, or have any type of drainage coming from them.   You have an oral temperature above 101.5 F .   You have ankle or leg swelling.   You have pain in your legs.   You have weight gain of 2 or more pounds a day.   You feel dizzy or lightheaded when you stand up.  SEEK IMMEDIATE MEDICAL CARE IF:  You have angina or chest pain that goes to your jaw or arms. Call your local emergency services right away.   You have shortness of breath at rest or with activity.   You have a fast or irregular heartbeat (arrhythmia).   There is a "clicking" in your sternum when you move.   You have numbness or weakness in your arms or legs.  MAKE SURE YOU:  Understand these instructions.   Will watch your condition.   Will get help right away if you are not doing well or get worse.    No lifting over 25 lbs for 3 months

## 2015-09-10 ENCOUNTER — Encounter: Payer: Self-pay | Admitting: Cardiovascular Disease

## 2015-09-10 ENCOUNTER — Encounter (INDEPENDENT_AMBULATORY_CARE_PROVIDER_SITE_OTHER): Payer: Self-pay

## 2015-09-10 ENCOUNTER — Ambulatory Visit (INDEPENDENT_AMBULATORY_CARE_PROVIDER_SITE_OTHER): Payer: Medicare Other | Admitting: Cardiovascular Disease

## 2015-09-10 VITALS — BP 118/58 | HR 71 | Ht 70.0 in | Wt 230.0 lb

## 2015-09-10 DIAGNOSIS — I2583 Coronary atherosclerosis due to lipid rich plaque: Secondary | ICD-10-CM

## 2015-09-10 DIAGNOSIS — I2111 ST elevation (STEMI) myocardial infarction involving right coronary artery: Secondary | ICD-10-CM | POA: Diagnosis not present

## 2015-09-10 DIAGNOSIS — I2510 Atherosclerotic heart disease of native coronary artery without angina pectoris: Secondary | ICD-10-CM

## 2015-09-10 DIAGNOSIS — Z951 Presence of aortocoronary bypass graft: Secondary | ICD-10-CM | POA: Diagnosis not present

## 2015-09-10 DIAGNOSIS — Z954 Presence of other heart-valve replacement: Secondary | ICD-10-CM | POA: Diagnosis not present

## 2015-09-10 DIAGNOSIS — Z952 Presence of prosthetic heart valve: Secondary | ICD-10-CM

## 2015-09-10 NOTE — Progress Notes (Signed)
Cardiology Office Note   Date:  09/10/2015   ID:  Wesley Martinez, DOB Dec 06, 1945, MRN [id number]  PCP:  Asencion Partridge, MD  Cardiologist:   Lorine Bears, MD   Chief Complaint  Patient presents with  . other    Follow up s/p CABG x 4 with AVR. Meds reviewed by the pt's med list. The patient had a stress test at [facility] yesterday.       History of Present Illness: Wesley Martinez is a 70 y.o. male who presents for a follow-up visit after hospitalization in March. He has known history of hyperlipidemia, hypertension and previous history of cardiac murmur. He presented to [facility] in March with acute inferior ST elevation myocardial infarction. He underwent emergent cardiac catheterization which showed an occluded midright coronary artery which was calcified and diffusely diseased proximally, significant left main and three-vessel coronary artery disease as well as severe aortic stenosis. He was transferred to [facility] where he underwent urgent CABG and aortic valve replacement with a bioprosthetic aortic valve. He had postoperative atrial fibrillation which was treated with amiodarone. He has been doing well with no recurrent chest pain or significant dyspnea. His angina manifested mostly by heartburn. He is attending cardiac rehabilitation at [facility]. He underwent a treadmill stress test there before cardiac rehabilitation. He had no chest pain but he did have hypertensive response to exercise with significant dyspnea.    Past Medical History  Diagnosis Date  . Gastric acidity   . Hypertension   . Hypercholesteremia   . Gout   . Coronary artery disease 07/2015     inferior ST elevation myocardial infarction in March 2017. Emergent cardiac catheterization showed occluded midright coronary artery, significant left main and three-vessel coronary artery disease with severe aortic stenosis. He underwent urgent 4 vessel CABG and bioprosthetic aortic valve replacement at [facility].   . Aortic valve disease    Severe aortic stenosis status post bioprosthetic aortic valve replacement with CABG in March 2017    Past Surgical History  Procedure Laterality Date  . Tonsillectomy    . Right rtc repair    . Prostatectomy    . Cardiac catheterization N/A 07/12/2015    Procedure: Right/Left Heart Cath and Coronary Angiography;  Surgeon: Iran Ouch, MD;  Location: [facility] INVASIVE CV LAB;  Service: Cardiovascular;  Laterality: N/A;  . Cardiac catheterization N/A 07/12/2015    Procedure: IABP Insertion;  Surgeon: Iran Ouch, MD;  Location: [facility] INVASIVE CV LAB;  Service: Cardiovascular;  Laterality: N/A;  . Aortic valve replacement N/A 07/15/2015    Procedure: AORTIC VALVE REPLACEMENT (AVR);  Surgeon: Delight Ovens, MD;  Location: [facility] OR;  Service: Open Heart Surgery;  Laterality: N/A;  . Tee without cardioversion N/A 07/15/2015    Procedure: TRANSESOPHAGEAL ECHOCARDIOGRAM (TEE);  Surgeon: Delight Ovens, MD;  Location: [facility] OR;  Service: Open Heart Surgery;  Laterality: N/A;  . Coronary artery bypass graft N/A 07/15/2015    Procedure: CORONARY ARTERY BYPASS GRAFTING (CABG) x four, using left greater saphenous vein harvested endoscopically;  Surgeon: Delight Ovens, MD;  Location: [facility] OR;  Service: Open Heart Surgery;  Laterality: N/A;     Current Outpatient Prescriptions  Medication Sig Dispense Refill  . aspirin EC 325 MG EC tablet Take 1 tablet (325 mg total) by mouth daily. 30 tablet 0  . atorvastatin (LIPITOR) 40 MG tablet Take 40 mg by mouth at bedtime.    . colchicine 0.6 MG tablet Take 0.6 mg by mouth daily.    Marland Kitchen  losartan (COZAAR) 50 MG tablet Take 25 mg by mouth daily.     . metoprolol tartrate (LOPRESSOR) 25 MG tablet Take 0.5 tablets (12.5 mg total) by mouth 2 (two) times daily. 30 tablet 3  . probenecid (BENEMID) 500 MG tablet Take 500 mg by mouth daily.      No current facility-administered medications for this visit.    Allergies:   Review of patient's allergies indicates no  known allergies.    Social History:  The patient  reports that he has never smoked. He does not have any smokeless tobacco history on file. He reports that he drinks about 0.6 oz of alcohol per week.   Family History:  The patient's Family history is negative for coronary artery disease.   ROS:  Please see the history of present illness.   Otherwise, review of systems are positive for none.   All other systems are reviewed and negative.    PHYSICAL EXAM: VS:  BP 118/58 mmHg  Pulse 71  Ht 5\' 10"  (1.778 m)  Wt 230 lb (104.327 kg)  BMI 33.00 kg/m2 , BMI Body mass index is 33 kg/(m^2). GEN: Well nourished, well developed, in no acute distress HEENT: normal Neck: no JVD, carotid bruits, or masses Cardiac: RRR; no murmurs, rubs, or gallops,no edema  Respiratory:  clear to auscultation bilaterally, normal work of breathing GI: soft, nontender, nondistended, + BS MS: no deformity or atrophy Skin: warm and dry, no rash Neuro:  Strength and sensation are intact Psych: euthymic mood, full affect   EKG:  EKG is ordered today. The ekg ordered today demonstrates normal sinus rhythm with old inferior infarct, anteroseptal infarct with T wave changes in the lateral leads suggestive of ischemia.   Recent Labs: 07/17/2015: ALT 29; TSH 1.367 07/18/2015: Magnesium 2.8* 07/20/2015: Hemoglobin 9.2*; Platelets 207 07/21/2015: BUN 19; Creatinine, Ser 1.17; Potassium 3.7; Sodium 139    Lipid Panel    Component Value Date/Time   CHOL 108 07/13/2015 0136   TRIG 82 07/13/2015 0136   HDL 28* 07/13/2015 0136   CHOLHDL 3.9 07/13/2015 0136   VLDL 16 07/13/2015 0136   LDLCALC 64 07/13/2015 0136      Wt Readings from Last 3 Encounters:  09/10/15 230 lb (104.327 kg)  08/22/15 231 lb 9.6 oz (105.053 kg)  07/21/15 259 lb 9.6 oz (117.754 kg)       ASSESSMENT AND PLAN:  1.  Coronary artery disease involving native coronary arteries without angina: The patient is status post inferior ST elevation  myocardial infarction in March of this year. He is doing well overall with no anginal symptoms. Into medical therapy and cardiac rehabilitation. I discontinued sucralfate today given that his heartburn was likely angina.  2. Ischemic cardiomyopathy: Ejection fraction was 30-35%. No evidence of volume overload. I requested an echocardiogram to reevaluate his ejection fraction and the newly replaced aortic valve.  3. Aortic valve disease: Status post bioprosthetic aortic valve replacement. An echocardiogram was requested to obtain a baseline.  4. Essential hypertension: Blood pressure is well controlled.  5. Post operative atrial fibrillation: He is currently in normal sinus rhythm. I decreased the dose of amiodarone to 200 mg once daily with plans to stop the medication in 2 weeks.    Disposition:   FU with me in 3 months  Signed,  Lorine Bears, MD  09/10/2015 5:29 PM    [facility]

## 2015-09-10 NOTE — Patient Instructions (Addendum)
Medication Instructions:  Your physician has recommended you make the following change in your medication:  DECREASE amiodarone to 200mg  (1/2 tablet) once daily for two weeks then STOP taking it. STOP taking carafate.   Labwork: none  Testing/Procedures: Your physician has requested that you have an echocardiogram. Echocardiography is a painless test that uses sound waves to create images of your heart. It provides your doctor with information about the size and shape of your heart and how well your heart's chambers and valves are working. This procedure takes approximately one hour. There are no restrictions for this procedure.    Follow-Up: Your physician recommends that you schedule a follow-up appointment in: 3 months with Dr. Kirke Corin.    Any Other Special Instructions Will Be Listed Below (If Applicable).     If you need a refill on your cardiac medications before your next appointment, please call your pharmacy.  Echocardiogram An echocardiogram, or echocardiography, uses sound waves (ultrasound) to produce an image of your heart. The echocardiogram is simple, painless, obtained within a short period of time, and offers valuable information to your health care provider. The images from an echocardiogram can provide information such as:  Evidence of coronary artery disease (CAD).  Heart size.  Heart muscle function.  Heart valve function.  Aneurysm detection.  Evidence of a past heart attack.  Fluid buildup around the heart.  Heart muscle thickening.  Assess heart valve function. LET [facility] CARE PROVIDER KNOW ABOUT:  Any allergies you have.  All medicines you are taking, including vitamins, herbs, eye drops, creams, and over-the-counter medicines.  Previous problems you or members of your family have had with the use of anesthetics.  Any blood disorders you have.  Previous surgeries you have had.  Medical conditions you have.  Possibility of  pregnancy, if this applies. BEFORE THE PROCEDURE  No special preparation is needed. Eat and drink normally.  PROCEDURE   In order to produce an image of your heart, gel will be applied to your chest and a wand-like tool (transducer) will be moved over your chest. The gel will help transmit the sound waves from the transducer. The sound waves will harmlessly bounce off your heart to allow the heart images to be captured in real-time motion. These images will then be recorded.  You may need an IV to receive a medicine that improves the quality of the pictures. AFTER THE PROCEDURE You may return to your normal schedule including diet, activities, and medicines, unless your health care provider tells you otherwise.   This information is not intended to replace advice given to you by your health care provider. Make sure you discuss any questions you have with your health care provider.   Document Released: 04/17/2000 Document Revised: 05/11/2014 Document Reviewed: 12/26/2012 Elsevier Interactive Patient Education [employer]

## 2015-09-20 ENCOUNTER — Ambulatory Visit: Payer: Medicare Other

## 2015-09-20 ENCOUNTER — Other Ambulatory Visit: Payer: Self-pay

## 2015-09-20 DIAGNOSIS — Z952 Presence of prosthetic heart valve: Secondary | ICD-10-CM

## 2015-09-24 ENCOUNTER — Other Ambulatory Visit: Payer: Self-pay | Admitting: Cardiovascular Disease

## 2015-11-21 ENCOUNTER — Ambulatory Visit: Payer: Medicare Other | Admitting: Cardiothoracic Surgery

## 2015-12-05 ENCOUNTER — Ambulatory Visit (INDEPENDENT_AMBULATORY_CARE_PROVIDER_SITE_OTHER): Payer: Medicare Other | Admitting: Cardiothoracic Surgery

## 2015-12-05 ENCOUNTER — Encounter: Payer: Self-pay | Admitting: Cardiothoracic Surgery

## 2015-12-05 VITALS — BP 120/72 | HR 62 | Resp 20 | Ht 70.0 in | Wt 235.0 lb

## 2015-12-05 DIAGNOSIS — Z954 Presence of other heart-valve replacement: Secondary | ICD-10-CM

## 2015-12-05 DIAGNOSIS — I350 Nonrheumatic aortic (valve) stenosis: Secondary | ICD-10-CM

## 2015-12-05 DIAGNOSIS — Z951 Presence of aortocoronary bypass graft: Secondary | ICD-10-CM | POA: Diagnosis not present

## 2015-12-05 DIAGNOSIS — I2510 Atherosclerotic heart disease of native coronary artery without angina pectoris: Secondary | ICD-10-CM | POA: Diagnosis not present

## 2015-12-05 DIAGNOSIS — Z952 Presence of prosthetic heart valve: Secondary | ICD-10-CM

## 2015-12-05 NOTE — Progress Notes (Signed)
[address]       [place] [address]             [phone]      [facility] Medical Record #[id number] Date of Birth: 01-03-1946  Referring: Iran Ouch, MD Primary Care: Asencion Partridge, MD  Chief Complaint:   POST OP FOLLOW UP 07/15/2015 OPERATIVE REPORT PREOPERATIVE DIAGNOSIS: Severe aortic stenosis and coronary occlusive disease with recent anterior apical myocardial infarction. POSTOPERATIVE DIAGNOSES: Severe aortic stenosis and coronary occlusive disease with recent anterior apical myocardial infarction. SURGICAL PROCEDURE: Aortic valve replacement with a 23 pericardial tissue valve [employer], model 3300 TFX, serial [id number] and coronary artery bypass grafting x4 with left internal mammary to the left anterior descending coronary artery, reverse saphenous vein graft to the posterior descending coronary artery, sequential reverse saphenous vein graft to the first and second obtuse marginal coronary arteries with left greater saphenous vein thigh and calf endovein harvesting. SURGEON: Sheliah Plane, MD  History of Present Illness:     Patient's made excellent progress after recent myocardial infarction in the setting of critical aortic stenosis and urgent coronary artery bypass grafting and aortic valve replacement. He's very rapidly returned to near-normal activity. He's had no recurrent angina or evidence of congestive heart failure. He is enrolled in cardiac rehabilitation through [facility] because this is closer to his home and will start next week.    Past Medical History:  Diagnosis Date  . Aortic valve disease    Severe aortic stenosis status post bioprosthetic aortic valve replacement with CABG in March 2017  . Coronary artery disease 07/2015    inferior ST elevation myocardial infarction in March 2017. Emergent cardiac catheterization showed occluded midright coronary artery, significant left main and  three-vessel coronary artery disease with severe aortic stenosis. He underwent urgent 4 vessel CABG and bioprosthetic aortic valve replacement at [facility].   . Gastric acidity   . Gout   . Hypercholesteremia   . Hypertension      History  Smoking Status  . Never Smoker  Smokeless Tobacco  . Not on file    History  Alcohol Use  . 0.6 oz/week  . 1 Glasses of wine per week    Comment: Occ.     No Known Allergies  Current Outpatient Prescriptions  Medication Sig Dispense Refill  . aspirin EC 325 MG EC tablet Take 1 tablet (325 mg total) by mouth daily. 30 tablet 0  . atorvastatin (LIPITOR) 40 MG tablet Take 40 mg by mouth at bedtime.    . colchicine 0.6 MG tablet Take 0.6 mg by mouth daily.    Marland Kitchen losartan (COZAAR) 50 MG tablet Take 25 mg by mouth daily.     . metoprolol tartrate (LOPRESSOR) 25 MG tablet Take 0.5 tablets (12.5 mg total) by mouth 2 (two) times daily. 30 tablet 3  . probenecid (BENEMID) 500 MG tablet Take 500 mg by mouth daily.      No current facility-administered medications for this visit.        Physical Exam: BP 120/72 (BP Location: Right Arm, Patient Position: Sitting, Cuff Size: Large)   Pulse 62   Resp 20   Ht  (1.778 m)   Wt 235 lb (106.6 kg)   SpO2 97% Comment: RA  BMI 33.72 kg/m   General appearance: alert and cooperative Neurologic: intact Heart: regular rate and rhythm, S1, S2 normal, no murmur, click, rub or gallop Lungs: clear to  auscultation bilaterally Abdomen: soft, non-tender; bowel sounds normal; no masses,  no organomegaly Extremities: extremities normal, atraumatic, no cyanosis or edema and Homans sign is negative, no sign of DVT Wound: Sternum is stable and 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    Recent Lab Findings: Lab Results  Component Value Date   WBC 10.3 07/20/2015   HGB 9.2 (L) 07/20/2015   HCT  29.0 (L) 07/20/2015   PLT 207 07/20/2015   GLUCOSE 145 (H) 07/21/2015   CHOL 108 07/13/2015   TRIG 82 07/13/2015   HDL 28 (L) 07/13/2015   LDLCALC 64 07/13/2015   ALT 29 07/17/2015   AST 38 07/17/2015   NA 139 07/21/2015   K 3.7 07/21/2015   CL 104 07/21/2015   CREATININE 1.17 07/21/2015   BUN 19 07/21/2015   CO2 24 07/21/2015   TSH 1.367 07/17/2015   INR 1.81 (H) 07/15/2015   HGBA1C 6.3 (H) 07/15/2015   Post op echo: Transthoracic Echocardiography  Patient:    Carlie, Corpus MR #:       [id number] Study Date: 09/20/2015 Gender:     M Age:        70 Height:     177.8 cm Weight:     104.3 kg BSA:        2.3 m^2 Pt. Status: Room:   ATTENDING    Default, Provider [phone]  Lessie Dings, MD  REFERRING    Lorine Bears, MD  PERFORMING   [place], [place]  SONOGRAPHER  Quentin Ore, RVT, RDCS, RDMS  cc:  ------------------------------------------------------------------- LV EF: 55% -   60%  ------------------------------------------------------------------- History:   PMH:  STEMI followed by CABG+BAVR on 07-15-15.  Coronary artery disease.  Risk factors:  Hypertension. Obese. Dyslipidemia.   ------------------------------------------------------------------- Study Conclusions  - Left ventricle: The cavity size was normal. There was mild focal   basal hypertrophy of the septum. Systolic function was normal.   The estimated ejection fraction was in the range of 55% to 60%.   Regional wall motion abnormalities cannot be excluded. Apical   images suggest mild inferior wall hypokinesis. Left ventricular   diastolic function parameters were normal. - Aortic valve: A bioprosthesis was present. Mean gradient (S): 8   mm Hg. Peak gradient (S): 15 mm Hg. - Aorta: Aortic root is mildly dilated, dimension: 37 mm (ED). - Left atrium: The atrium was moderately dilated. - Pulmonary arteries: Systolic pressure was within the normal    range.  Impressions:  - Challenging apical and subcostal images. Difficult to exclude   regional wall motion abnormality. If clinically indicated,   consider definity study.  ------------------------------------------------------------------- Labs, prior tests, procedures, and surgery: Coronary artery bypass grafting.  Transthoracic echocardiography.  M-mode, complete 2D, spectral Doppler, and color Doppler.  Birthdate:  Patient birthdate: 11-30-1945.  Age:  Patient is 70 yr old.  Sex:  Gender: male. BMI: 33 kg/m^2.  Blood pressure:     120/78  Patient status: Outpatient.  Study date:  Study date: 09/20/2015. Study time: 03:44 PM.  -------------------------------------------------------------------  ------------------------------------------------------------------- Left ventricle:  The cavity size was normal. There was mild focal basal hypertrophy of the septum. Systolic function was normal. The estimated ejection fraction was in the range of 55% to 60%. Regional wall motion abnormalities cannot be excluded. Apical images suggest mild inferior wall hypokinesis. The transmitral flow pattern was normal.  The deceleration time of the early transmitral flow velocity was normal. The pulmonary vein flow pattern was normal. The tissue Doppler parameters were normal. Left ventricular diastolic function parameters were normal.  ------------------------------------------------------------------- Aortic valve:  Poorly visualized.  Normal thickness leaflets. A bioprosthesis was present. Mobility was not restricted.  Doppler: Transvalvular velocity was within the normal range. There was no stenosis. There was no regurgitation.    VTI ratio of LVOT to aortic valve: 0.5. Valve area (VTI): 1.58 cm^2. Indexed valve area (VTI): 0.69 cm^2/m^2. Peak velocity ratio of LVOT to aortic valve: 0.55. Valve area (Vmax): 1.72 cm^2. Indexed valve area (Vmax): 0.75 cm^2/m^2. Mean velocity ratio of  LVOT to aortic valve: 0.54. Valve area (Vmean): 1.7 cm^2. Indexed valve area (Vmean): 0.74 cm^2/m^2.   Mean gradient (S): 8 mm Hg. Peak gradient (S): 15 mm Hg.  ------------------------------------------------------------------- Aorta:  Aortic root: The aortic root was normal in size.  ------------------------------------------------------------------- Mitral valve:   Structurally normal valve.   Mobility was not restricted.  Doppler:  Transvalvular velocity was within the normal range. There was no evidence for stenosis. There was no regurgitation.    Peak gradient (D): 5 mm Hg.  ------------------------------------------------------------------- Left atrium:  The atrium was moderately dilated.  ------------------------------------------------------------------- Right ventricle:  The cavity size was normal. Wall thickness was normal. Systolic function was normal.  ------------------------------------------------------------------- Pulmonic valve:    Structurally normal valve.   Cusp separation was normal.  Doppler:  Transvalvular velocity was within the normal range. There was no evidence for stenosis. There was no regurgitation.  ------------------------------------------------------------------- Tricuspid valve:   Structurally normal valve.    Doppler: Transvalvular velocity was within the normal range. There was no regurgitation.  ------------------------------------------------------------------- Pulmonary artery:   The main pulmonary artery was normal-sized. Systolic pressure was within the normal range.  ------------------------------------------------------------------- Right atrium:  The atrium was normal in size.  ------------------------------------------------------------------- Pericardium:  There was no pericardial effusion.  ------------------------------------------------------------------- Systemic veins: Inferior vena cava: The vessel was normal  in size.  ------------------------------------------------------------------- Measurements   Left ventricle                            Value          Reference  LV ID, ED, PLAX chordal                   51.2  mm       43 - 52  LV ID, ES, PLAX chordal                   34.1  mm       23 - 38  LV fx shortening, PLAX chordal            33    %        >=29  LV PW thickness, ED                       12.2  mm       ---------  IVS/LV PW ratio, ED                       0.91           <=1.3  Stroke volume, 2D                         74    ml       ---------  Stroke volume/bsa, 2D                     32    ml/m^2   ---------  LV e&', lateral                            9.99  cm/s     ---------  LV E/e&', lateral                          11.71          ---------  LV e&', medial                             4.73  cm/s     ---------  LV E/e&', medial                           24.74          ---------  LV e&', average                            7.36  cm/s     ---------  LV E/e&', average                          15.9           ---------    Ventricular septum                        Value          Reference  IVS thickness, ED                         11.1  mm       ---------    LVOT                                      Value          Reference  LVOT ID, S                                20    mm       ---------  LVOT area                                 3.14  cm^2     ---------  LVOT ID                                   20    mm       ---------  LVOT peak velocity, S                     105   cm/s     ---------  LVOT mean velocity, S                     69.4  cm/s     ---------  LVOT VTI, S                               23.6  cm       ---------  Stroke volume (SV), LVOT DP               74.1  ml       ---------  Stroke index (SV/bsa), LVOT DP            32.2  ml/m^2   ---------    Aortic valve                              Value          Reference  Aortic valve peak velocity, S             192   cm/s      ---------  Aortic valve mean velocity, S             128   cm/s     ---------  Aortic valve VTI, S                       47    cm       ---------  Aortic mean gradient, S                   8     mm Hg    ---------  Aortic peak gradient, S                   15    mm Hg    ---------  VTI ratio, LVOT/AV                        0.5            ---------  Aortic valve area, VTI                    1.58  cm^2     ---------  Aortic valve area/bsa, VTI                0.69  cm^2/m^2 ---------  Velocity ratio, peak, LVOT/AV             0.55           ---------  Aortic valve area, peak velocity          1.72  cm^2     ---------  Aortic valve area/bsa, peak               0.75  cm^2/m^2 ---------  velocity  Velocity ratio, mean, LVOT/AV             0.54           ---------  Aortic valve area, mean velocity          1.7   cm^2     ---------  Aortic valve area/bsa, mean               0.74  cm^2/m^2 ---------  velocity    Aorta  Value          Reference  Aortic root ID, ED                        37    mm       ---------    Left atrium                               Value          Reference  LA ID, A-P, ES                            50    mm       ---------  LA ID/bsa, A-P                            2.17  cm/m^2   <=2.2  LA volume, S                              70.6  ml       ---------  LA volume/bsa, S                          30.7  ml/m^2   ---------  LA volume, ES, 1-p A4C                    67.5  ml       ---------  LA volume/bsa, ES, 1-p A4C                29.3  ml/m^2   ---------  LA volume, ES, 1-p A2C                    72    ml       ---------  LA volume/bsa, ES, 1-p A2C                31.3  ml/m^2   ---------    Mitral valve                              Value          Reference  Mitral E-wave peak velocity               117   cm/s     ---------  Mitral A-wave peak velocity               91.7  cm/s     ---------  Mitral deceleration time                   204   ms       150 - 230  Mitral peak gradient, D                   5     mm Hg    ---------  Mitral E/A ratio, peak                    1.3            ---------    Right ventricle  Value          Reference  TAPSE                                     14.1  mm       ---------    Pulmonic valve                            Value          Reference  Pulmonic valve peak velocity, S           60.1  cm/s     ---------  Pulmonic regurg velocity, ED              77.4  cm/s     ---------  Legend: (L)  and  (H)  mark values outside specified reference range.  ------------------------------------------------------------------- Prepared and Electronically Authenticated by  Dossie Arbour, MD, [facility] 2017-05-19T17:17:02   Assessment / Plan:      Patient Continues to make good postoperative progress following recent urgent coronary artery bypass grafting and aortic valve replacement with pericardial tissue valve. Patient has no signs or symptoms of congestive heart failure,  Systolic function was moderately to severely reduced preoperatively. The estimated ejection fraction was in the range of 30% to 35%, post op echo ef now 55%   He is currently on, aspirin, statin, losartan and metoprolol.    Overall the patient made excellent progress postoperatively, have not made a return appointment for him to see me but would be glad to see him at his or cardiology requested anytime.   With the patient's prosthetic heart valve the risks of endocarditis have been discussed. The recommendations for periprocedural antibiotics including dental procedures have been discussed with the patient.  Delight Ovens MD      [address] [place] [place] [address] Office [phone]   Beeper [phone]  12/05/2015 2:11 PM

## 2015-12-05 NOTE — Patient Instructions (Signed)

## 2015-12-09 ENCOUNTER — Other Ambulatory Visit: Payer: Self-pay | Admitting: Cardiovascular Disease

## 2015-12-10 ENCOUNTER — Telehealth: Payer: Self-pay | Admitting: Cardiovascular Disease

## 2015-12-10 ENCOUNTER — Ambulatory Visit: Payer: Medicare Other | Admitting: Cardiovascular Disease

## 2015-12-10 NOTE — Telephone Encounter (Signed)
Called patient to reschedule appointment with Dr Kirke Corin But could not get a hold of him  Spoke to patient wife  She states he knew about it and would call next week after they come back into town They would like [place] to please send in refills to [facility] in [place]  It was requested yesterday (see in epic)  They are going out of town tomorrow and would like to get them before they leave. Please advise.

## 2016-01-09 ENCOUNTER — Ambulatory Visit (INDEPENDENT_AMBULATORY_CARE_PROVIDER_SITE_OTHER): Payer: Medicare Other | Admitting: Cardiovascular Disease

## 2016-01-09 ENCOUNTER — Encounter: Payer: Self-pay | Admitting: Cardiovascular Disease

## 2016-01-09 VITALS — BP 130/72 | HR 63 | Ht 70.0 in | Wt 243.0 lb

## 2016-01-09 DIAGNOSIS — E785 Hyperlipidemia, unspecified: Secondary | ICD-10-CM | POA: Diagnosis not present

## 2016-01-09 DIAGNOSIS — I10 Essential (primary) hypertension: Secondary | ICD-10-CM | POA: Diagnosis not present

## 2016-01-09 DIAGNOSIS — I2510 Atherosclerotic heart disease of native coronary artery without angina pectoris: Secondary | ICD-10-CM | POA: Diagnosis not present

## 2016-01-09 DIAGNOSIS — Z954 Presence of other heart-valve replacement: Secondary | ICD-10-CM

## 2016-01-09 DIAGNOSIS — Z952 Presence of prosthetic heart valve: Secondary | ICD-10-CM

## 2016-01-09 NOTE — Patient Instructions (Signed)
Medication Instructions: Continue same medications.   Labwork: None.   Procedures/Testing: None.   Follow-Up: 6 months with Dr. Domenic Schoenberger.   Any Additional Special Instructions Will Be Listed Below (If Applicable).     If you need a refill on your cardiac medications before your next appointment, please call your pharmacy.   

## 2016-01-09 NOTE — Progress Notes (Signed)
Cardiology Office Note   Date:  01/09/2016   ID:  Wesley Martinez, DOB February 01, 1946, MRN [id number]  PCP:  Asencion Partridge, MD  Cardiologist:   Lorine Bears, MD   Chief Complaint  Patient presents with  . Other    3 month f/u no complaints. Meds reviewed verbally with pt.      History of Present Illness: Wesley Martinez is a 70 y.o. male who presents for a follow-up visit regarding CAD and aortic valve disease s/p CABG and AVR with bioprosthetic valve in 07/2015.  He has other chronic medical conditions that includeHyperlipidemia and hypertension. He had inferior ST elevation myocardial infarction in March. Cardiac catheterization showed severe three-vessel disease and severe aortic stenosis. He underwent successful CABG and aortic valve replacement. Ejection fraction was moderately reduced. Most recent echocardiogram in May showed normal LV systolic function with mild inferior wall hypokinesis, normal functioning bioprosthetic aortic valve and moderately dilated left atrium. She has been doing very well and denies any chest pain or shortness of breath. His angina at time of myocardial infarction manifested by heartburn. He attended cardiac rehabilitation and overall he is doing extremely well.    Past Medical History:  Diagnosis Date  . Aortic valve disease    Severe aortic stenosis status post bioprosthetic aortic valve replacement with CABG in March 2017  . Coronary artery disease 07/2015    inferior ST elevation myocardial infarction in March 2017. Emergent cardiac catheterization showed occluded midright coronary artery, significant left main and three-vessel coronary artery disease with severe aortic stenosis. He underwent urgent 4 vessel CABG and bioprosthetic aortic valve replacement at [facility].   . Gastric acidity   . Gout   . Hypercholesteremia   . Hypertension     Past Surgical History:  Procedure Laterality Date  . AORTIC VALVE REPLACEMENT N/A 07/15/2015   Procedure:  AORTIC VALVE REPLACEMENT (AVR);  Surgeon: Delight Ovens, MD;  Location: [facility] OR;  Service: Open Heart Surgery;  Laterality: N/A;  . CARDIAC CATHETERIZATION N/A 07/12/2015   Procedure: Right/Left Heart Cath and Coronary Angiography;  Surgeon: Iran Ouch, MD;  Location: [facility] INVASIVE CV LAB;  Service: Cardiovascular;  Laterality: N/A;  . CARDIAC CATHETERIZATION N/A 07/12/2015   Procedure: IABP Insertion;  Surgeon: Iran Ouch, MD;  Location: [facility] INVASIVE CV LAB;  Service: Cardiovascular;  Laterality: N/A;  . CORONARY ARTERY BYPASS GRAFT N/A 07/15/2015   Procedure: CORONARY ARTERY BYPASS GRAFTING (CABG) x four, using left greater saphenous vein harvested endoscopically;  Surgeon: Delight Ovens, MD;  Location[facility] OR;  Service: Open Heart Surgery;  Laterality: N/A;  . PROSTATECTOMY    . Right RTC Repair    . TEE WITHOUT CARDIOVERSION N/A 07/15/2015   Procedure: TRANSESOPHAGEAL ECHOCARDIOGRAM (TEE);  Surgeon: Delight Ovens, MD;  Location: [facility] OR;  Service: Open Heart Surgery;  Laterality: N/A;  . TONSILLECTOMY       Current Outpatient Prescriptions  Medication Sig Dispense Refill  . aspirin EC 325 MG EC tablet Take 1 tablet (325 mg total) by mouth daily. 30 tablet 0  . atorvastatin (LIPITOR) 40 MG tablet take 1 tablet by mouth once daily 90 tablet 3  . colchicine 0.6 MG tablet Take 0.6 mg by mouth daily.    Marland Kitchen losartan (COZAAR) 50 MG tablet Take 25 mg by mouth daily.     . metoprolol tartrate (LOPRESSOR) 25 MG tablet take 1/2 tablet by mouth twice a day 30 tablet 3  . probenecid (BENEMID) 500 MG tablet Take  500 mg by mouth daily.      No current facility-administered medications for this visit.     Allergies:   Review of patient's allergies indicates no known allergies.    Social History:  The patient  reports that he has never smoked. He has never used smokeless tobacco. He reports that he drinks about 0.6 oz of alcohol per week . He reports that he does not use drugs.    Family History:  The patient's Family history is negative for coronary artery disease.   ROS:  Please see the history of present illness.   Otherwise, review of systems are positive for none.   All other systems are reviewed and negative.    PHYSICAL EXAM: VS:  BP 130/72 (BP Location: Left Arm, Patient Position: Sitting, Cuff Size: Normal)   Pulse 63   Ht 5\' 10"  (1.778 m)   Wt 243 lb (110.2 kg)   BMI 34.87 kg/m  , BMI Body mass index is 34.87 kg/m. GEN: Well nourished, well developed, in no acute distress  HEENT: normal  Neck: no JVD, carotid bruits, or masses Cardiac: RRR; no murmurs, rubs, or gallops,no edema  Respiratory:  clear to auscultation bilaterally, normal work of breathing GI: soft, nontender, nondistended, + BS MS: no deformity or atrophy  Skin: warm and dry, no rash Neuro:  Strength and sensation are intact Psych: euthymic mood, full affect   EKG:  EKG is not ordered today.    Recent Labs: 07/17/2015: ALT 29; TSH 1.367 07/18/2015: Magnesium 2.8 07/20/2015: Hemoglobin 9.2; Platelets 207 07/21/2015: BUN 19; Creatinine, Ser 1.17; Potassium 3.7; Sodium 139    Lipid Panel    Component Value Date/Time   CHOL 108 07/13/2015 0136   TRIG 82 07/13/2015 0136   HDL 28 (L) 07/13/2015 0136   CHOLHDL 3.9 07/13/2015 0136   VLDL 16 07/13/2015 0136   LDLCALC 64 07/13/2015 0136      Wt Readings from Last 3 Encounters:  01/09/16 243 lb (110.2 kg)  12/05/15 235 lb (106.6 kg)  09/10/15 230 lb (104.3 kg)       ASSESSMENT AND PLAN:  1.  Coronary artery disease involving native coronary arteries without angina:  He is doing very well overall with no anginal symptoms. Continue medical therapy.  2. Ischemic cardiomyopathy: Ejection fraction was 30-35% at time of myocardial infarction but improved to normal in May.  3. Aortic valve disease: Status post bioprosthetic aortic valve replacement. Echocardiogram showed normal functioning aortic valve prosthesis.  4.  Essential hypertension: Blood pressure is well controlled.   5. Hyperlipidemia: Continue treatment with atorvastatin. Most recent LDL was 64.   Disposition:   FU with me in 6 months  Signed,  Lorine BearsMuhammad Zarra Geffert, MD  01/09/2016 12:13 PM     [facility]

## 2016-04-03 ENCOUNTER — Telehealth: Payer: Self-pay | Admitting: Cardiovascular Disease

## 2016-04-03 NOTE — Telephone Encounter (Signed)
Error

## 2016-07-07 ENCOUNTER — Encounter: Payer: Self-pay | Admitting: Cardiovascular Disease

## 2016-07-07 ENCOUNTER — Ambulatory Visit (INDEPENDENT_AMBULATORY_CARE_PROVIDER_SITE_OTHER): Payer: Medicare Other | Admitting: Cardiovascular Disease

## 2016-07-07 VITALS — BP 122/72 | HR 72 | Ht 70.0 in | Wt 258.0 lb

## 2016-07-07 DIAGNOSIS — Z952 Presence of prosthetic heart valve: Secondary | ICD-10-CM

## 2016-07-07 DIAGNOSIS — E785 Hyperlipidemia, unspecified: Secondary | ICD-10-CM | POA: Diagnosis not present

## 2016-07-07 DIAGNOSIS — I2510 Atherosclerotic heart disease of native coronary artery without angina pectoris: Secondary | ICD-10-CM

## 2016-07-07 DIAGNOSIS — I10 Essential (primary) hypertension: Secondary | ICD-10-CM

## 2016-07-07 MED ORDER — METOPROLOL SUCCINATE ER 25 MG PO TB24: 25.0000 mg | ORAL_TABLET | Freq: Every day | ORAL | 11 refills | Status: DC

## 2016-07-07 NOTE — Patient Instructions (Signed)
Medication Instructions:  Your physician has recommended you make the following change in your medication:  STOP taking metoprolol tartrate START taking metoprolol XL 25mg  once daily   Labwork: none  Testing/Procedures: none  Follow-Up: Your physician wants you to follow-up in: one year with Dr. Kirke Corin.  You will receive a reminder letter in the mail two months in advance. If you don't receive a letter, please call our office to schedule the follow-up appointment.   Any Other Special Instructions Will Be Listed Below (If Applicable).     If you need a refill on your cardiac medications before your next appointment, please call your pharmacy.

## 2016-07-07 NOTE — Progress Notes (Signed)
Cardiology Office Note   Date:  07/07/2016   ID:  Wesley Martinez, DOB [birth year], MRN [id number]  PCP:  Asencion Partridge, MD  Cardiologist:   Lorine Bears, MD   Chief Complaint  Patient presents with  . other    6 month follow up. Patient states he is doing well. Meds reviewed verbally with patient      History of Present Illness: Wesley Martinez is a 71 y.o. male who presents for a follow-up visit regarding CAD and aortic valve disease s/p CABG and AVR with bioprosthetic valve in 07/2015.  He has other chronic medical conditions that include hyperlipidemia and hypertension. He had inferior ST elevation myocardial infarction in March, 2017. Cardiac catheterization showed severe three-vessel disease and severe aortic stenosis. He underwent successful CABG and aortic valve replacement. Ejection fraction was moderately reduced. Most recent echocardiogram in May showed normal LV systolic function with mild inferior wall hypokinesis, normal functioning bioprosthetic aortic valve and moderately dilated left atrium.  He has been doing extremely well with no chest pain, shortness of breath or palpitations. His biggest issue seems to be continued weight came due to poor diet choices. He continues to go to the [facility] for exercise 3 times weekly.   Past Medical History:  Diagnosis Date  . Aortic valve disease    Severe aortic stenosis status post bioprosthetic aortic valve replacement with CABG in March 2017  . Coronary artery disease 07/2015    inferior ST elevation myocardial infarction in March 2017. Emergent cardiac catheterization showed occluded midright coronary artery, significant left main and three-vessel coronary artery disease with severe aortic stenosis. He underwent urgent 4 vessel CABG and bioprosthetic aortic valve replacement at [facility].   . Gastric acidity   . Gout   . Hypercholesteremia   . Hypertension     Past Surgical History:  Procedure Laterality Date  . AORTIC VALVE  REPLACEMENT N/A 07/15/2015   Procedure: AORTIC VALVE REPLACEMENT (AVR);  Surgeon: Delight Ovens, MD;  Location: [facility] OR;  Service: Open Heart Surgery;  Laterality: N/A;  . CARDIAC CATHETERIZATION N/A 07/12/2015   Procedure: Right/Left Heart Cath and Coronary Angiography;  Surgeon: Iran Ouch, MD;  Location: [facility] INVASIVE CV LAB;  Service: Cardiovascular;  Laterality: N/A;  . CARDIAC CATHETERIZATION N/A 07/12/2015   Procedure: IABP Insertion;  Surgeon: Iran Ouch, MD;  Location: [facility] INVASIVE CV LAB;  Service: Cardiovascular;  Laterality: N/A;  . CORONARY ARTERY BYPASS GRAFT N/A 07/15/2015   Procedure: CORONARY ARTERY BYPASS GRAFTING (CABG) x four, using left greater saphenous vein harvested endoscopically;  Surgeon: Delight Ovens, MD;  Location: [facility] OR;  Service: Open Heart Surgery;  Laterality: N/A;  . PROSTATECTOMY    . Right RTC Repair    . TEE WITHOUT CARDIOVERSION N/A 07/15/2015   Procedure: TRANSESOPHAGEAL ECHOCARDIOGRAM (TEE);  Surgeon: Delight Ovens, MD;  Location: [facility] OR;  Service: Open Heart Surgery;  Laterality: N/A;  . TONSILLECTOMY       Current Outpatient Prescriptions  Medication Sig Dispense Refill  . aspirin EC 325 MG EC tablet Take 1 tablet (325 mg total) by mouth daily. 30 tablet 0  . atorvastatin (LIPITOR) 40 MG tablet take 1 tablet by mouth once daily 90 tablet 3  . colchicine 0.6 MG tablet Take 0.6 mg by mouth daily.    Marland Kitchen losartan (COZAAR) 50 MG tablet Take 25 mg by mouth daily.     . metoprolol tartrate (LOPRESSOR) 25 MG tablet take 1/2 tablet by mouth twice a  day 30 tablet 3  . probenecid (BENEMID) 500 MG tablet Take 500 mg by mouth daily.      No current facility-administered medications for this visit.     Allergies:   Patient has no known allergies.    Social History:  The patient  reports that he has never smoked. He has never used smokeless tobacco. He reports that he drinks about 0.6 oz of alcohol per week . He reports that he does not use  drugs.   Family History:  The patient's Family history is negative for coronary artery disease.   ROS:  Please see the history of present illness.   Otherwise, review of systems are positive for none.   All other systems are reviewed and negative.    PHYSICAL EXAM: VS:  BP 122/72 (BP Location: Left Arm, Patient Position: Sitting, Cuff Size: Normal)   Pulse 72   Ht 5\' 10"  (1.778 m)   Wt 258 lb (117 kg)   BMI 37.02 kg/m  , BMI Body mass index is 37.02 kg/m. GEN: Well nourished, well developed, in no acute distress  HEENT: normal  Neck: no JVD, carotid bruits, or masses Cardiac: RRR; no murmurs, rubs, or gallops,no edema  Respiratory:  clear to auscultation bilaterally, normal work of breathing GI: soft, nontender, nondistended, + BS MS: no deformity or atrophy  Skin: warm and dry, no rash Neuro:  Strength and sensation are intact Psych: euthymic mood, full affect   EKG:  EKG is  ordered today. EKG showed normal sinus rhythm, old inferior infarct, old anteroseptal infarct and lateral T wave changes.   Recent Labs: 07/17/2015: ALT 29; TSH 1.367 07/18/2015: Magnesium 2.8 07/20/2015: Hemoglobin 9.2; Platelets 207 07/21/2015: BUN 19; Creatinine, Ser 1.17; Potassium 3.7; Sodium 139    Lipid Panel    Component Value Date/Time   CHOL 108 07/13/2015 0136   TRIG 82 07/13/2015 0136   HDL 28 (L) 07/13/2015 0136   CHOLHDL 3.9 07/13/2015 0136   VLDL 16 07/13/2015 0136   LDLCALC 64 07/13/2015 0136      Wt Readings from Last 3 Encounters:  07/07/16 258 lb (117 kg)  01/09/16 243 lb (110.2 kg)  12/05/15 235 lb (106.6 kg)       ASSESSMENT AND PLAN:  1.  Coronary artery disease involving native coronary arteries without angina:  He is doing very well overall with no anginal symptoms. Continue medical therapy.  2. Ischemic cardiomyopathy: Ejection fraction was 30-35% at time of myocardial infarction but improved to normal in May.  3. Aortic valve disease: Status post  bioprosthetic aortic valve replacement. Echocardiogram showed normal functioning aortic valve prosthesis.  4. Essential hypertension: Blood pressure is well controlled.However, he tends to forget taking the evening dose of metoprolol tartrate. Thus, I elected to switch him to metoprolol succinate 25 mg once daily.   5. Hyperlipidemia: Continue treatment with atorvastatin. Most recent LDL was 64.  6. Obesity: The patient has been gradually gaining weight. I discussed with him the importance of healthy diet and decreasing portions.  Disposition:   FU with me in 12  months  Signed,  Lorine Bears, MD  07/07/2016 2:30 PM    [facility]

## 2017-01-05 IMAGING — CR DG CHEST 1V PORT
1 series · 1 of 1 positions shown · non-contrast
Comparison: Portable exam 9554 hours compared to 07/15/2015

CLINICAL DATA: Post CABG and AVR

EXAM:
PORTABLE CHEST 1 VIEW

[AP]
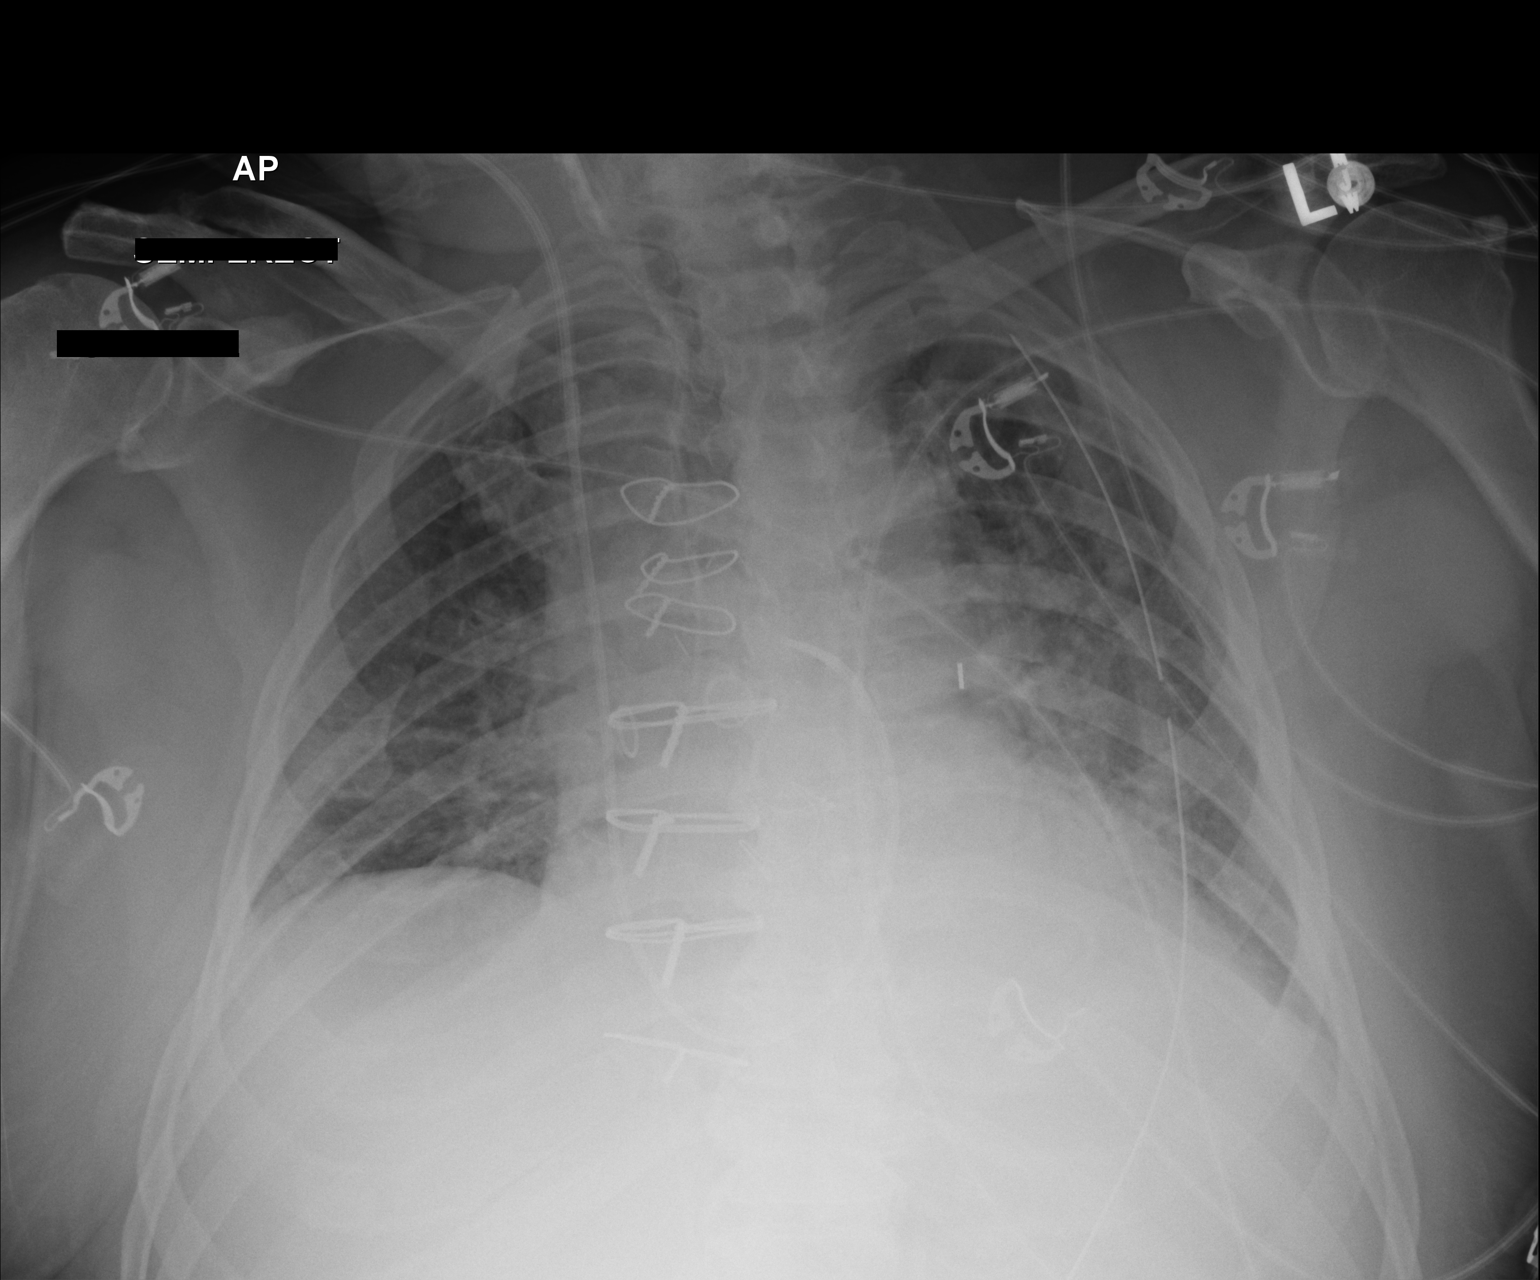

[1 of 1 positions shown; findings below may reference images not displayed]

FINDINGS: Interval removal of endotracheal and nasogastric tubes.

RIGHT jugular Swan-Ganz catheter with tip projecting over proximal
RIGHT pulmonary artery.

Tip of intra-aortic balloon pump projects over LEFT hilum below
aortic arch.

LEFT thoracostomy tube present.

Enlargement of cardiac silhouette post CABG and AVR.

Pulmonary vascular congestion.

Perihilar infiltrates likely pulmonary edema.

Mild bibasilar atelectasis.

No pneumothorax.
IMPRESSION: Line and tube positions as above.

Persistent perihilar edema and bibasilar atelectasis.

## 2017-01-06 IMAGING — CR DG CHEST 1V PORT
1 series · 1 of 1 positions shown · non-contrast
Comparison: 07/16/2015

CLINICAL DATA: CABG and aortic valve replacement.

EXAM:
PORTABLE CHEST 1 VIEW

[AP]
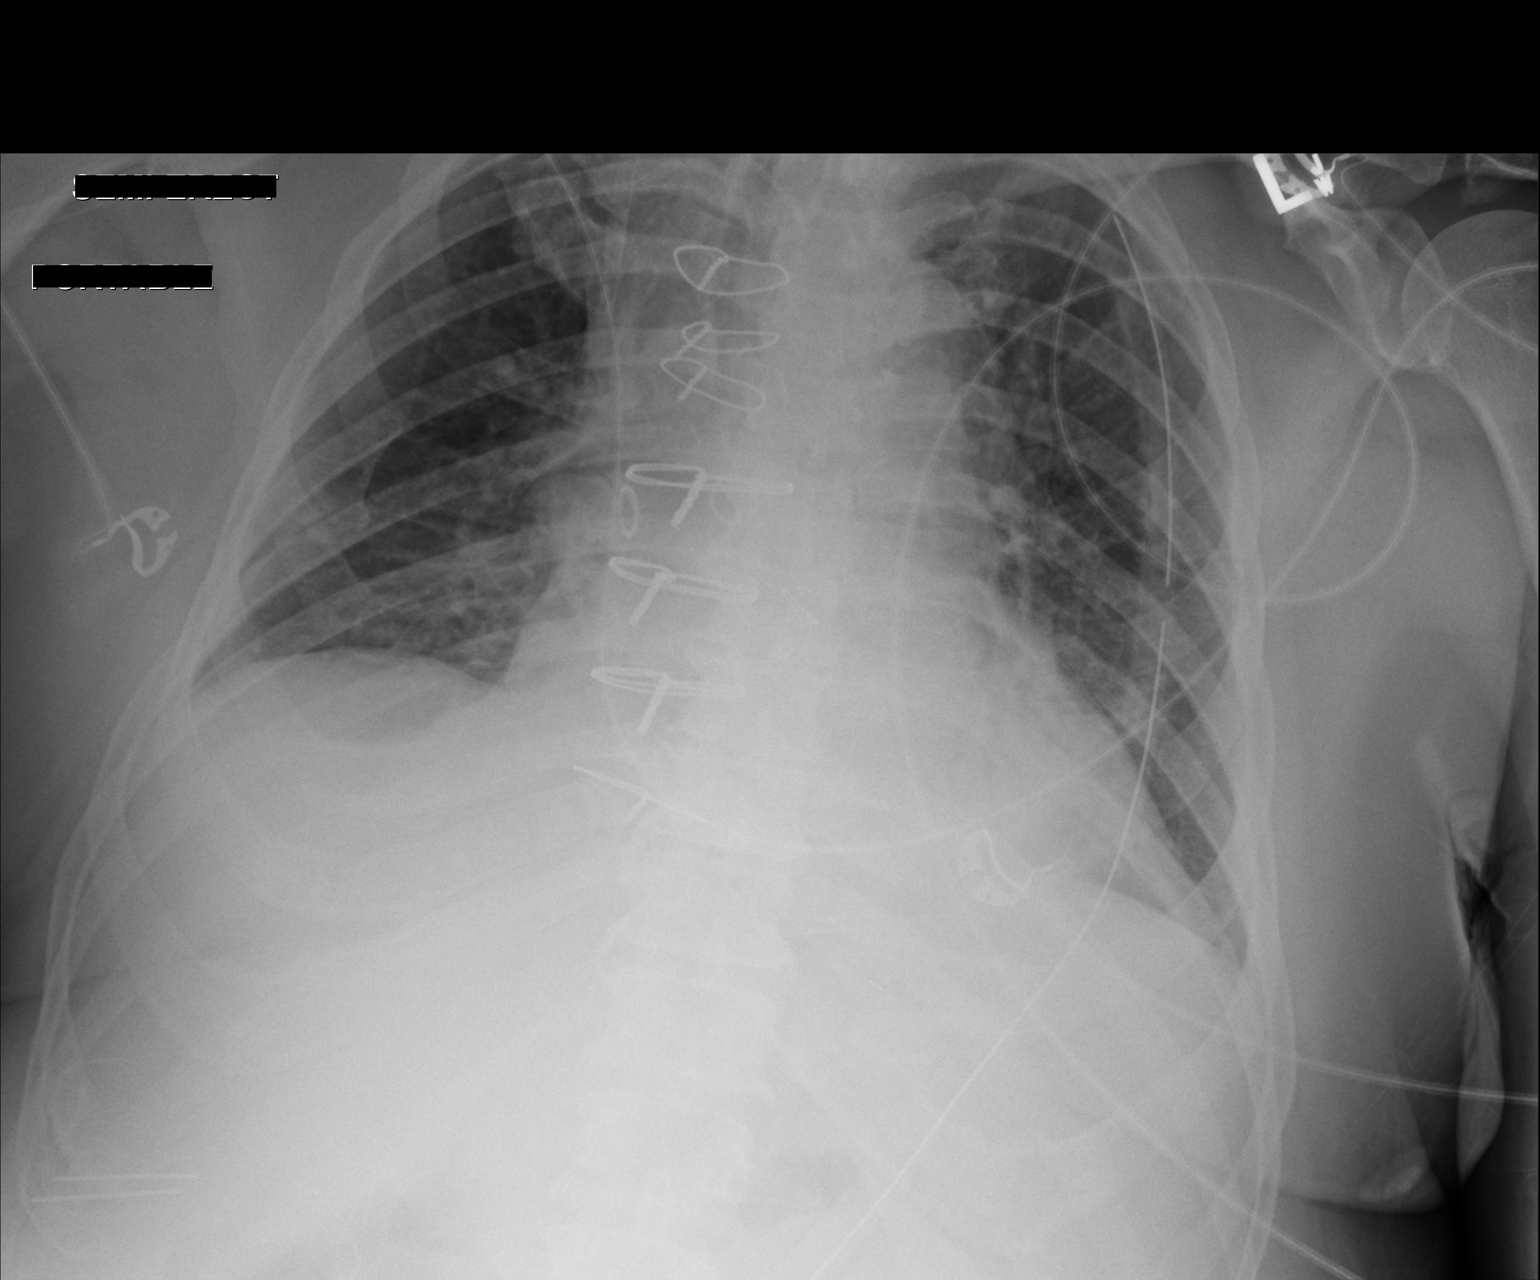

[1 of 1 positions shown; findings below may reference images not displayed]

FINDINGS: Sternotomy wires unchanged. Patient slightly rotated to the right.
Right IJ sheath in place with tip over the SVC. Left-sided chest
tube unchanged. Lungs are adequately inflated with mild improved
opacification in the left retrocardiac region likely atelectasis. No
pneumothorax. Mild stable cardiomegaly. Remainder of the exam is
unchanged.
IMPRESSION: Mild improved left base opacification likely atelectasis.

Mild stable cardiomegaly.

Tubes and lines as described.

## 2017-01-07 IMAGING — CR DG CHEST 1V PORT
1 series · 1 of 1 positions shown · non-contrast
Comparison: July 17, 2015.

CLINICAL DATA: Chest tube removal.

EXAM:
PORTABLE CHEST 1 VIEW

[AP]
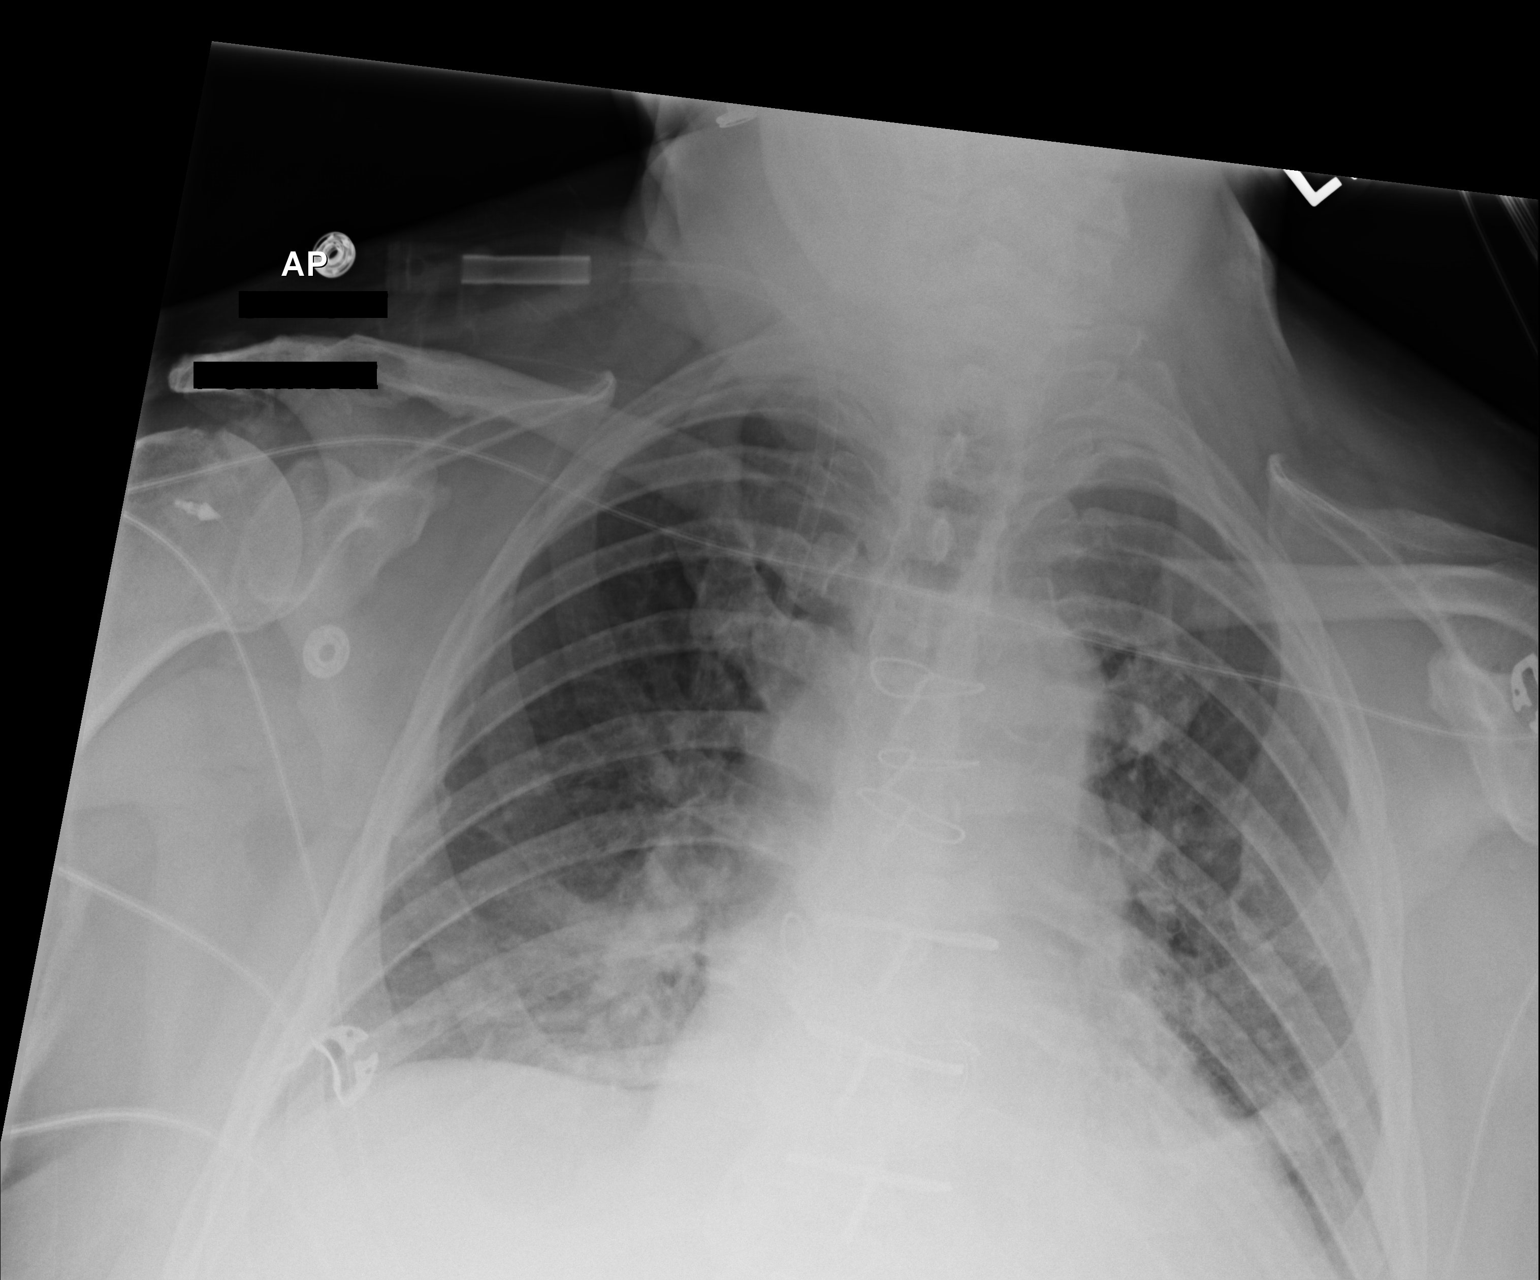

[1 of 1 positions shown; findings below may reference images not displayed]

FINDINGS: Stable cardiomegaly. Status post coronary artery bypass graft.
Left-sided chest tube noted on prior exam has been removed. No
pneumothorax is noted. Mildly increased bibasilar opacities are
noted most consistent with subsegmental atelectasis. Hypoinflation
of the lungs is noted. Bony thorax is unremarkable.
IMPRESSION: Hypoinflation of the lungs with mildly increased bibasilar opacities
most consistent with subsegmental atelectasis. Left-sided chest tube
has been removed without evidence of pneumothorax.

## 2017-02-11 IMAGING — CR DG CHEST 2V
2 series · 2 of 2 positions shown · non-contrast
Comparison: July 19, 2015

CLINICAL DATA: History of coronary artery bypass grafting for
coronary artery disease. Hypertension. History of aortic valve
disease

EXAM:
CHEST  2 VIEW

[w chest pa]
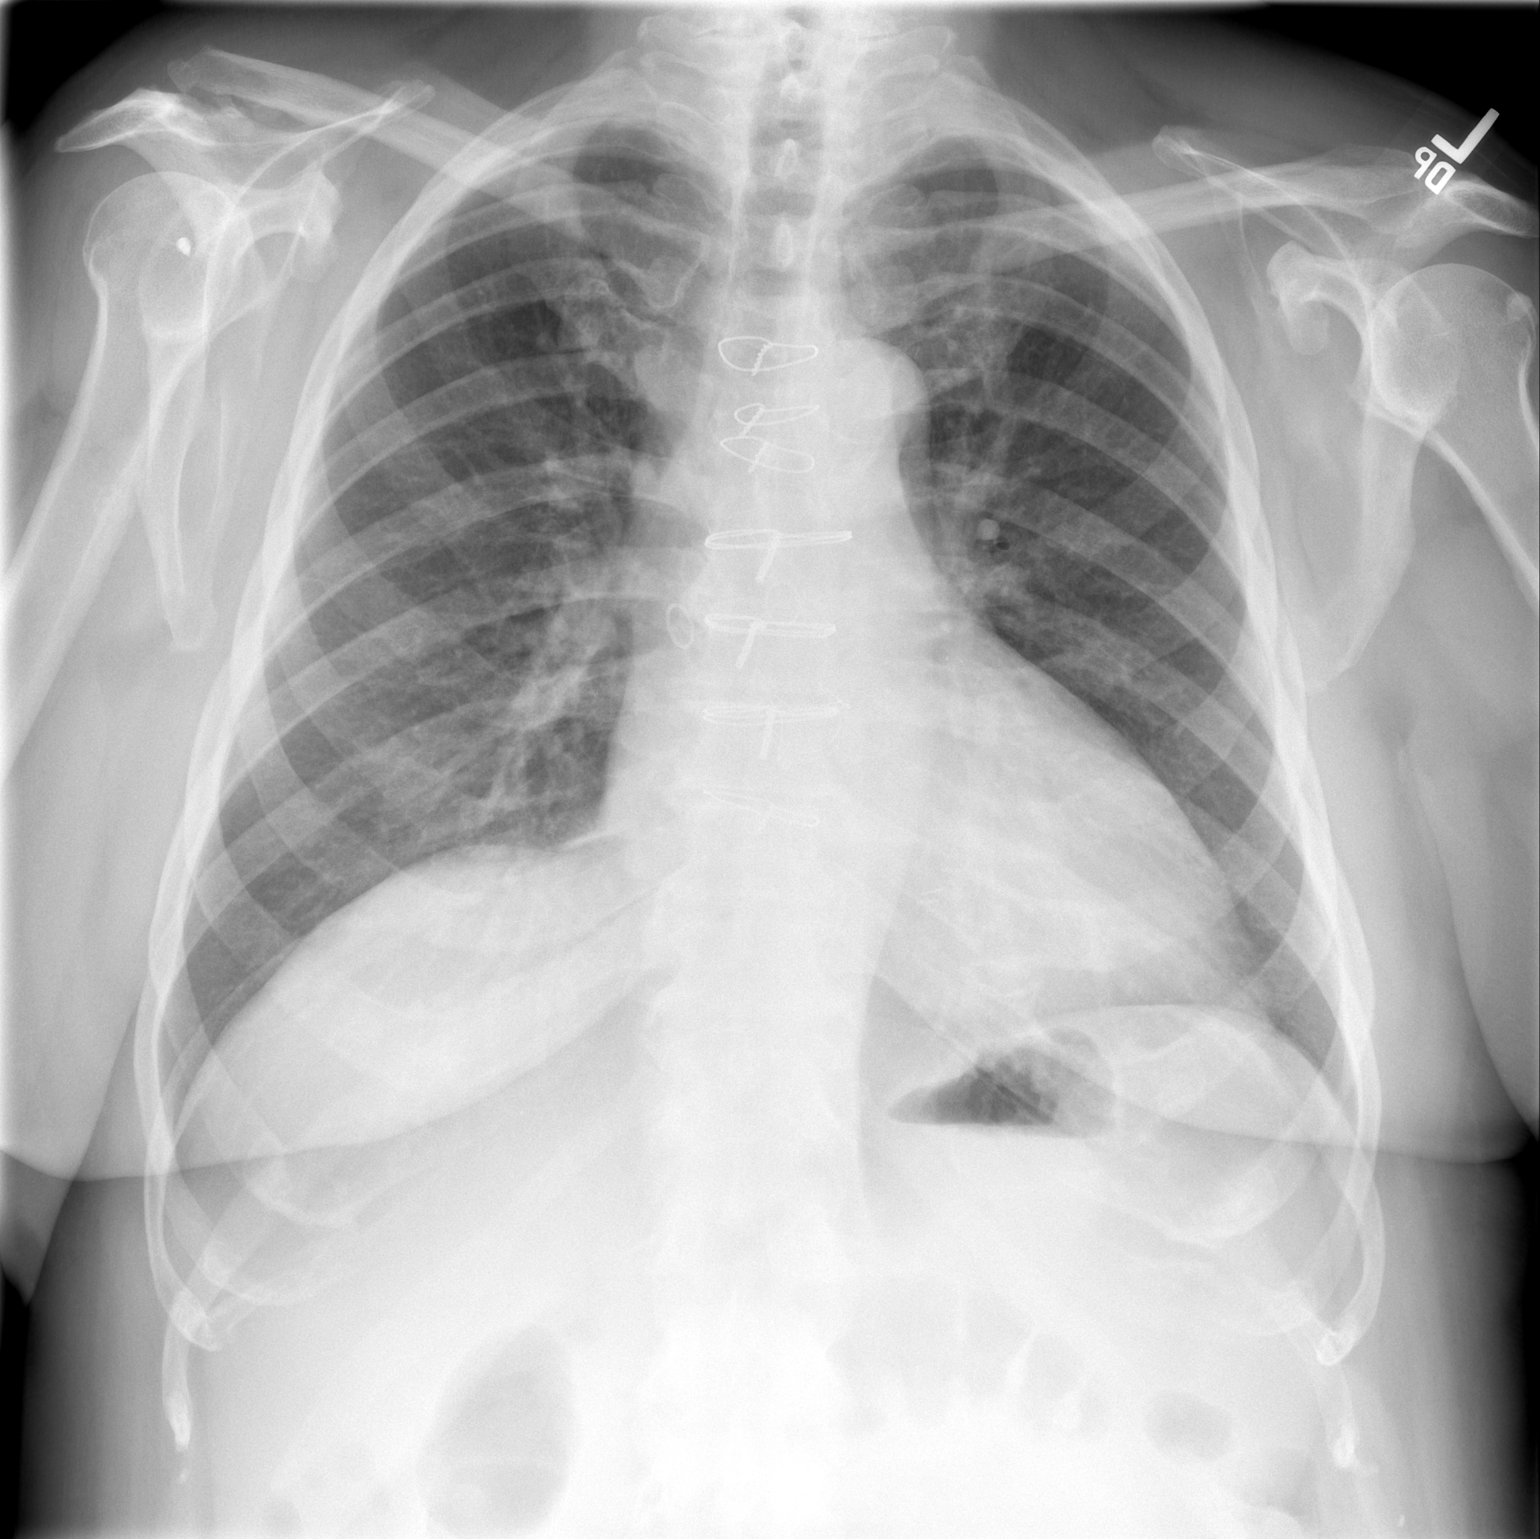

[w chest lat]
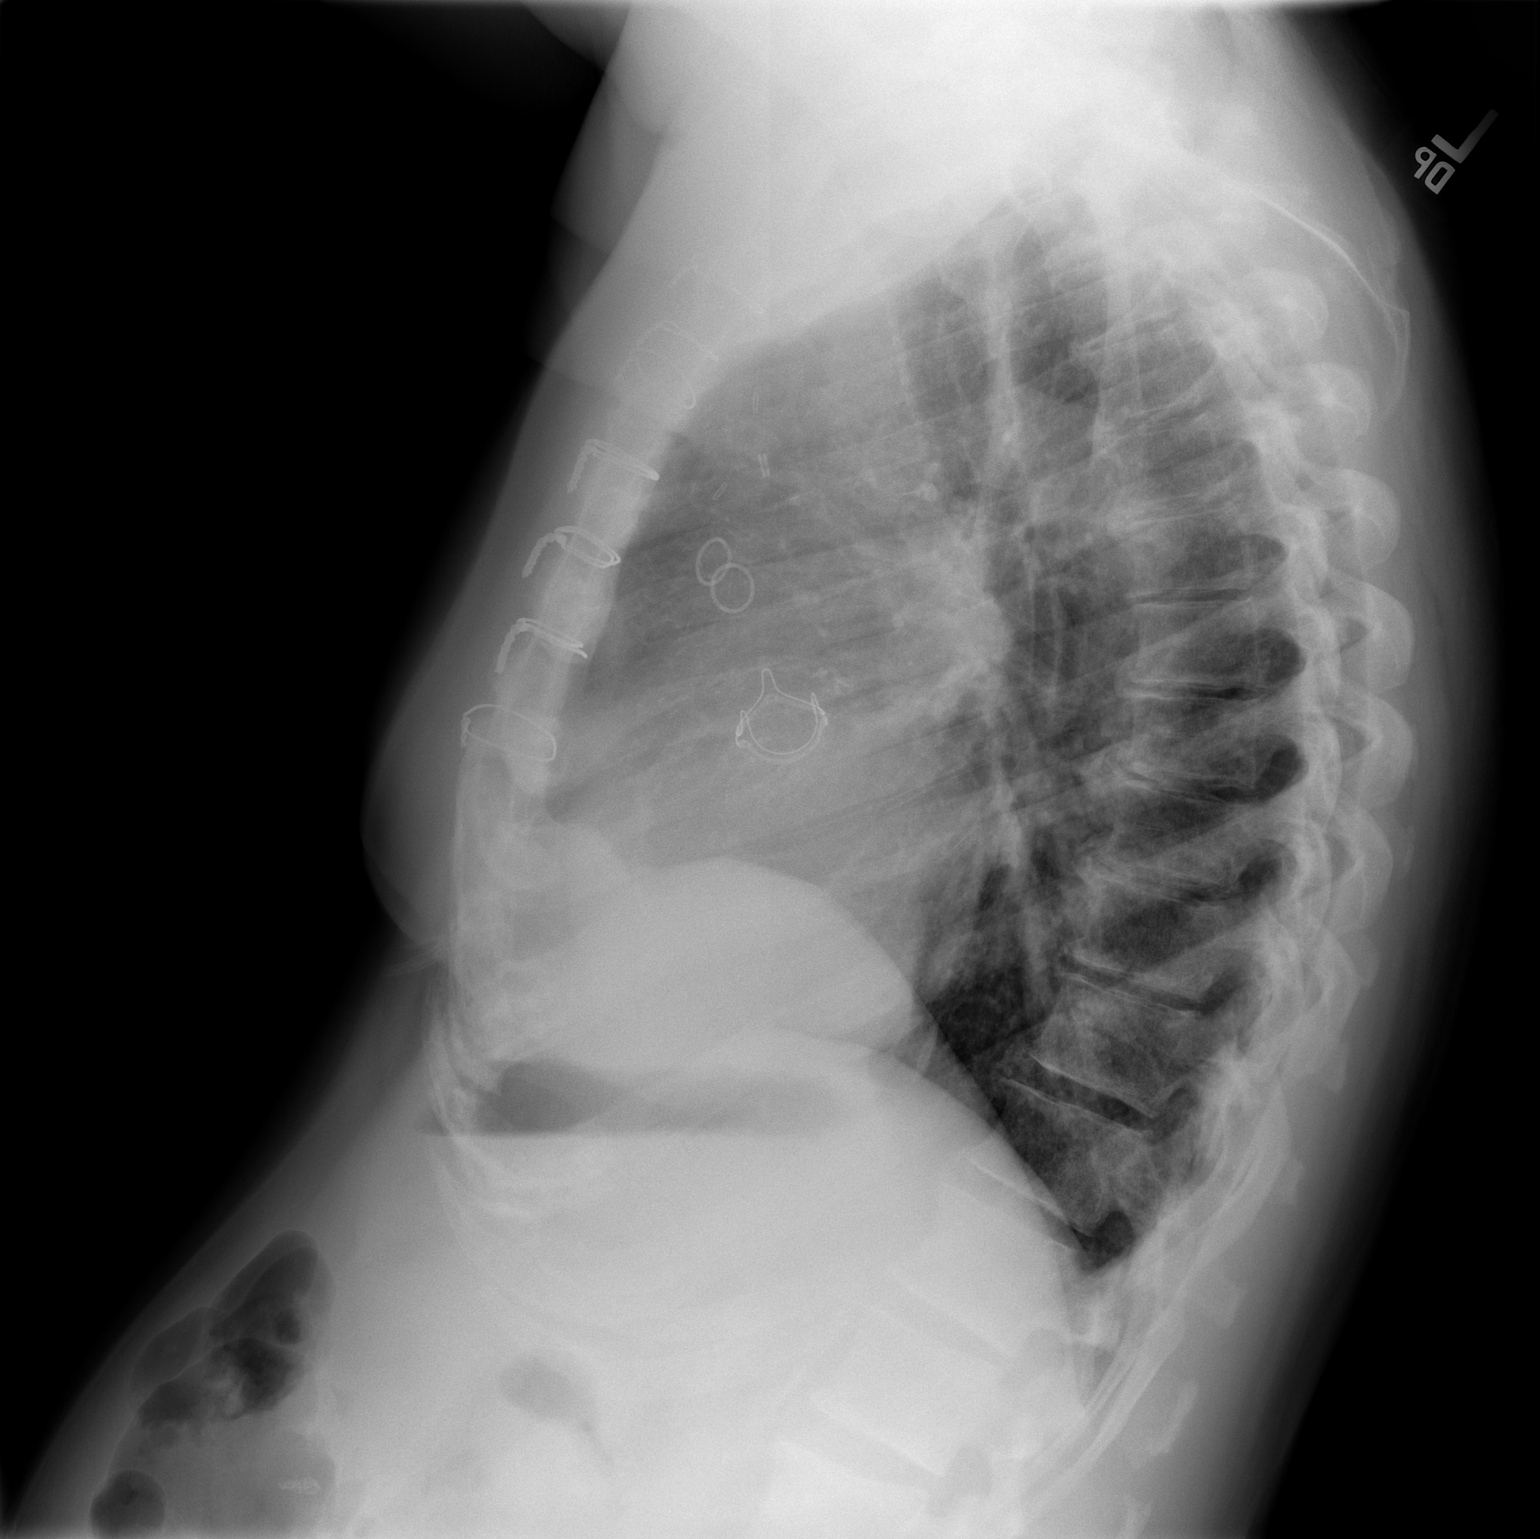

[2 of 2 positions shown; findings below may reference images not displayed]

FINDINGS: There is no edema or consolidation. Heart is borderline enlarged
with pulmonary vascularity within normal limits. Patient is status
post coronary artery bypass grafting and aortic valve replacement.
No adenopathy. There is postoperative change in the right shoulder.
There is mild degenerative change in the thoracic spine.
IMPRESSION: Heart borderline enlarged with evidence of prior coronary artery
bypass grafting and aortic valve replacement. No edema or
consolidation.

## 2017-03-12 ENCOUNTER — Other Ambulatory Visit: Payer: Self-pay | Admitting: Cardiovascular Disease

## 2017-07-20 ENCOUNTER — Other Ambulatory Visit: Payer: Self-pay | Admitting: Cardiovascular Disease

## 2017-09-17 ENCOUNTER — Other Ambulatory Visit: Payer: Self-pay | Admitting: Cardiovascular Disease

## 2017-09-20 ENCOUNTER — Telehealth: Payer: Self-pay

## 2017-09-20 NOTE — Telephone Encounter (Signed)
l mom to call and schedule past due f/u 

## 2017-09-20 NOTE — Telephone Encounter (Signed)
-----   Message from Festus Aloe, CMA sent at 09/20/2017  8:59 AM EDT ----- Please contact patient for a follow up appointment.  The patient was to be seen in March 2019. Thanks, Jasmine December

## 2017-09-23 NOTE — Telephone Encounter (Signed)
Lmov for patient to call and schedule appointment ° °

## 2017-09-30 NOTE — Telephone Encounter (Signed)
Lmov for patient to call and schedule appointment ° °

## 2017-10-08 ENCOUNTER — Encounter: Payer: Self-pay | Admitting: Cardiovascular Disease

## 2017-10-08 NOTE — Telephone Encounter (Signed)
3 attempts to schedule fu appt from recall list.   Deleting recall.   

## 2017-10-08 NOTE — Telephone Encounter (Signed)
lmov to schedule appt .  Mailed Letter.   °

## 2017-10-08 NOTE — Telephone Encounter (Signed)
Looks like Dr. Kirke Corin patient Maybe send a letter? TG

## 2017-11-18 ENCOUNTER — Other Ambulatory Visit: Payer: Self-pay | Admitting: Cardiovascular Disease

## 2018-05-01 ENCOUNTER — Other Ambulatory Visit: Payer: Self-pay | Admitting: Cardiovascular Disease

## 2020-04-11 ENCOUNTER — Telehealth: Payer: Self-pay | Admitting: Cardiovascular Disease

## 2020-04-11 NOTE — Telephone Encounter (Signed)
Declined recalls tx care to [facility]
